# Patient Record
Sex: Female | Born: 2000 | Race: Black or African American | Hispanic: No | Marital: Single | Smoking: Never smoker
Health system: Southern US, Community
[De-identification: ages and names within clinical notes are randomized; demographics above are authoritative.]

## PROBLEM LIST (undated history)

## (undated) DIAGNOSIS — Z789 Other specified health status: Secondary | ICD-10-CM

## (undated) HISTORY — DX: Other specified health status: Z78.9

## (undated) HISTORY — PX: NO PAST SURGERIES: SHX2092

---

## 2019-01-29 ENCOUNTER — Other Ambulatory Visit: Payer: Self-pay

## 2019-01-29 ENCOUNTER — Inpatient Hospital Stay (HOSPITAL_COMMUNITY)
Admission: EM | Admit: 2019-01-29 | Discharge: 2019-01-30 | Disposition: A | Discharge status: Home / Self Care | Payer: BC Managed Care – PPO | Attending: Obstetrics & Gynecology | Admitting: Obstetrics & Gynecology

## 2019-01-29 ENCOUNTER — Encounter (HOSPITAL_COMMUNITY): Payer: Self-pay | Admitting: Emergency Medicine

## 2019-01-29 DIAGNOSIS — Z3491 Encounter for supervision of normal pregnancy, unspecified, first trimester: Secondary | ICD-10-CM | POA: Insufficient documentation

## 2019-01-29 DIAGNOSIS — Z3A13 13 weeks gestation of pregnancy: Secondary | ICD-10-CM | POA: Insufficient documentation

## 2019-01-29 DIAGNOSIS — Z3689 Encounter for other specified antenatal screening: Secondary | ICD-10-CM | POA: Diagnosis not present

## 2019-01-29 DIAGNOSIS — Z3201 Encounter for pregnancy test, result positive: Secondary | ICD-10-CM

## 2019-01-29 LAB — POC URINE PREG, ED: Preg Test, Ur: POSITIVE — AB

## 2019-01-29 NOTE — ED Triage Notes (Signed)
Pt presents requesting preg test, pt states she did have +preg test end of July, some spotting in August and the week of Sept 12. +POC urine here. Pt has not been seen by OB/GYN. No bleeding today.

## 2019-01-30 ENCOUNTER — Other Ambulatory Visit: Payer: Self-pay

## 2019-01-30 ENCOUNTER — Encounter (HOSPITAL_COMMUNITY): Payer: Self-pay

## 2019-01-30 DIAGNOSIS — Z3689 Encounter for other specified antenatal screening: Secondary | ICD-10-CM

## 2019-01-30 NOTE — Discharge Instructions (Signed)
[facility] ° ° °[facility]       Phone: [phone] ° °[facility] Phone: [phone] ° °[facility] [place]  Phone: [phone] ° °[facility] [place]  Phone: [phone] ° °[facility]  Phone: [phone] ° °[facility]       Phone: [phone] ° °[facility]    Phone: [phone]  ° °[facility]    Phone: [phone] ° °[facility]    Phone: [phone] ° °[facility]    Phone: [phone]  ° °[facility]  Phone: [phone] ° °[facility]    Phone: [phone] ° °[facility]   Phone: [phone] ° °[facility]    Phone: [phone] ° °

## 2019-01-30 NOTE — MAU Provider Note (Signed)
First Provider Initiated Contact with Patient 01/30/19 0037      S Ms. Sophia King is a 18 y.o. G1P0 at 13.0 weeks by LMP.  She reports that she "just want to find out how far along" she is.  Patient confirms that her LMP October 31, 2018 was normal and that she had positive home UPT in July.  Patient reports bleeding on Sept 12 that was self limiting and has not occurred since.  Patient denies all pain and vaginal concerns.   O BP 118/66 (BP Location: Right Arm)   Pulse 90   Temp 98.7 F (37.1 C) (Oral)   Resp 16   Ht 5\' 9"  (1.753 m)   Wt 91.2 kg   LMP 10/31/2018 (Approximate)   SpO2 100%   BMI 29.70 kg/m  Physical Exam  Constitutional: She is oriented to person, place, and time. She appears well-developed and well-nourished.  HENT:  Head: Normocephalic and atraumatic.  Eyes: Conjunctivae are normal.  Neck: Normal range of motion.  Cardiovascular: Normal rate.  Respiratory: Effort normal.  GI: Soft.  Musculoskeletal: Normal range of motion.  Neurological: She is alert and oriented to person, place, and time.  Skin: Skin is warm and dry.  Psychiatric: She has a normal mood and affect. Her behavior is normal.   Patient informed that the ultrasound is considered a limited OB ultrasound and is not intended to be a complete ultrasound exam.  Patient also informed that the ultrasound is not being completed with the intent of assessing for fetal or placental anomalies or any pelvic abnormalities.  Explained that the purpose of today's ultrasound is to assess for  viability.  Patient acknowledges the purpose of the exam and the limitations of the study.   A Pregnant female No Complaints  Medical screening exam complete   P -Attempted to doppler without success. -BSUS performed and no IUP noted. -Patient informed that dating may be incorrect, but that she would have to follow up with primary ob. -Patient offered and accepts list of ob providers in the community. -Bleeding  Precautions Given. -Discharge from MAU in stable condition -Patient encouraged to return to MAU as needed for pregnancy related complaints.  Gavin Pound, CNM 01/30/2019 12:38 AM

## 2019-01-30 NOTE — MAU Note (Signed)
Pt had +upt in ED. Wants to verify pregnancy. Had some spotting on 01/15/19. None today. No pain today. LMP: 10/31/18. Just moved to [place] a few weeks ago and has not seen provider. Looking for recommendations.

## 2019-02-08 ENCOUNTER — Telehealth: Payer: Self-pay | Admitting: General Practice

## 2019-02-08 NOTE — Telephone Encounter (Signed)
Pt aware of appointment change on 02/17/2019 at 1:00pm and verbalized understanding.

## 2019-02-17 ENCOUNTER — Other Ambulatory Visit: Payer: Self-pay

## 2019-02-17 ENCOUNTER — Ambulatory Visit (INDEPENDENT_AMBULATORY_CARE_PROVIDER_SITE_OTHER): Payer: BC Managed Care – PPO | Admitting: *Deleted

## 2019-02-17 VITALS — Wt 200.0 lb

## 2019-02-17 DIAGNOSIS — Z3400 Encounter for supervision of normal first pregnancy, unspecified trimester: Secondary | ICD-10-CM | POA: Insufficient documentation

## 2019-02-17 NOTE — Progress Notes (Signed)
  Virtual Visit via Telephone Note  I connected with Sophia King on 02/17/19 at  1:00 PM EDT by telephone and verified that I am speaking with the correct person using two identifiers.  Location: Patient: Sophia King MRN: [id number] Provider: Derl Barrow, RN   I discussed the limitations, risks, security and privacy concerns of performing an evaluation and management service by telephone and the availability of in person appointments. I also discussed with the patient that there may be a patient responsible charge related to this service. The patient expressed understanding and agreed to proceed.   History of Present Illness: PRENATAL INTAKE SUMMARY  Sophia King presents today New OB Nurse Interview.  OB History    Gravida  1   Para      Term      Preterm      AB      Living        SAB      TAB      Ectopic      Multiple      Live Births             I have reviewed the patient's medical, obstetrical, social, and family histories, medications, and available lab results.  SUBJECTIVE She complains of vaginal bleeding (spotting, sometime after sex).   Observations/Objective: Initial nurse interview for history/labs (New OB).  EDD: 09/09/2019 by LMP GA: [redacted]w[redacted]d G1P0 FHT: non face to face interview  GENERAL APPEARANCE: non face to face  Assessment and Plan: Normal pregnancy Prenatal care- [facility] Labs/physical to be completed at next visit. Advised patient on warner signs of pregnancy Pt to purchase PNV Pt to sign up for Babys[facility].  Follow Up Instructions:   I discussed the assessment and treatment plan with the patient. The patient was provided an opportunity to ask questions and all were answered. The patient agreed with the plan and demonstrated an understanding of the instructions.   The patient was advised to call back or seek an in-person evaluation if the symptoms worsen or if the condition fails to improve as anticipated.  I  provided 10 minutes of non-face-to-face time during this encounter.   Derl Barrow, RN

## 2019-02-21 ENCOUNTER — Other Ambulatory Visit: Payer: Self-pay | Admitting: *Deleted

## 2019-02-21 DIAGNOSIS — Z3400 Encounter for supervision of normal first pregnancy, unspecified trimester: Secondary | ICD-10-CM

## 2019-02-21 NOTE — Progress Notes (Signed)
Per Laury Deep CNM's order; patient need early ultrasound to confirm dating.  Derl Barrow, RN

## 2019-02-22 ENCOUNTER — Inpatient Hospital Stay (HOSPITAL_COMMUNITY): Payer: BC Managed Care – PPO

## 2019-02-22 ENCOUNTER — Encounter (HOSPITAL_COMMUNITY): Payer: Self-pay

## 2019-02-22 ENCOUNTER — Inpatient Hospital Stay (HOSPITAL_COMMUNITY)
Admission: AD | Admit: 2019-02-22 | Discharge: 2019-02-22 | Disposition: A | Discharge status: Home / Self Care | Payer: BC Managed Care – PPO | Attending: Obstetrics & Gynecology | Admitting: Obstetrics & Gynecology

## 2019-02-22 ENCOUNTER — Other Ambulatory Visit: Payer: Self-pay

## 2019-02-22 DIAGNOSIS — Z3A11 11 weeks gestation of pregnancy: Secondary | ICD-10-CM | POA: Diagnosis not present

## 2019-02-22 DIAGNOSIS — A5901 Trichomonal vulvovaginitis: Secondary | ICD-10-CM | POA: Diagnosis present

## 2019-02-22 DIAGNOSIS — O98811 Other maternal infectious and parasitic diseases complicating pregnancy, first trimester: Secondary | ICD-10-CM

## 2019-02-22 DIAGNOSIS — O209 Hemorrhage in early pregnancy, unspecified: Secondary | ICD-10-CM | POA: Diagnosis not present

## 2019-02-22 DIAGNOSIS — N939 Abnormal uterine and vaginal bleeding, unspecified: Secondary | ICD-10-CM

## 2019-02-22 DIAGNOSIS — O98311 Other infections with a predominantly sexual mode of transmission complicating pregnancy, first trimester: Secondary | ICD-10-CM | POA: Diagnosis not present

## 2019-02-22 LAB — URINALYSIS, ROUTINE W REFLEX MICROSCOPIC
Bilirubin Urine: NEGATIVE
Glucose, UA: NEGATIVE mg/dL
Ketones, ur: NEGATIVE mg/dL
Nitrite: NEGATIVE
Protein, ur: NEGATIVE mg/dL
Specific Gravity, Urine: 1.019 (ref 1.005–1.030)
pH: 5 (ref 5.0–8.0)

## 2019-02-22 LAB — WET PREP, GENITAL
Clue Cells Wet Prep HPF POC: NONE SEEN
Sperm: NONE SEEN
Yeast Wet Prep HPF POC: NONE SEEN

## 2019-02-22 MED ORDER — METRONIDAZOLE 500 MG PO TABS
2000.0000 mg | ORAL_TABLET | Freq: Once | ORAL | Status: AC
  Administered 2019-02-22: 2000 mg via ORAL
  Filled 2019-02-22 (×2): qty 4

## 2019-02-22 NOTE — Discharge Instructions (Signed)
Trichomoniasis Trichomoniasis is an STI (sexually transmitted infection) that can affect both women and men. In women, the outer area of the female genitalia (vulva) and the vagina are affected. In men, mainly the penis is affected, but the prostate and other reproductive organs can also be involved.  This condition can be treated with medicine. It often has no symptoms (is asymptomatic), especially in men. If not treated, trichomoniasis can last for months or years. What are the causes? This condition is caused by a parasite called Trichomonas vaginalis. Trichomoniasis most often spreads from person to person (is contagious) through sexual contact. What increases the risk? The following factors may make you more likely to develop this condition:  Having unprotected sex.  Having sex with a partner who has trichomoniasis.  Having multiple sexual partners.  Having had previous trichomoniasis infections or other STIs. What are the signs or symptoms? In women, symptoms of trichomoniasis include:  Abnormal vaginal discharge that is clear, white, gray, or yellow-green and foamy and has an unusual "fishy" odor.  Itching and irritation of the vagina and vulva.  Burning or pain during urination or sex.  Redness and swelling of the genitals. In men, symptoms of trichomoniasis include:  Penile discharge that may be foamy or contain pus.  Pain in the penis. This may happen only when urinating.  Itching or irritation inside the penis.  Burning after urination or ejaculation. How is this diagnosed? In women, this condition may be found during a routine Pap test or physical exam. It may be found in men during a routine physical exam. Your health care provider may do tests to help diagnose this infection, such as:  Urine tests (men and women).  The following in women: ? Testing the pH of the vagina. ? A vaginal swab test that checks for the Trichomonas vaginalis parasite. ? Testing vaginal  secretions. Your health care provider may test you for other STIs, including HIV (human immunodeficiency virus). How is this treated? This condition is treated with medicine taken by mouth (orally), such as metronidazole or tinidazole, to fight the infection. Your sexual partner(s) also need to be tested and treated.  If you are a woman and you plan to become pregnant or think you may be pregnant, tell your health care provider right away. Some medicines that are used to treat the infection should not be taken during pregnancy. Your health care provider may recommend over-the-counter medicines or creams to help relieve itching or irritation. You may be tested for infection again 3 months after treatment. Follow these instructions at home:  Take and use over-the-counter and prescription medicines, including creams, only as told by your health care provider.  Take your antibiotic medicine as told by your health care provider. Do not stop taking the antibiotic even if you start to feel better.  Do not have sex until 7-10 days after you finish your medicine, or until your health care provider approves. Ask your health care provider when you may start to have sex again.  (Women) Do not douche or wear tampons while you have the infection.  Discuss your infection with your sexual partner(s). Make sure that your partner gets tested and treated, if necessary.  Keep all follow-up visits as told by your health care provider. This is important. How is this prevented?   Use condoms every time you have sex. Using condoms correctly and consistently can help protect against STIs.  Avoid having multiple sexual partners.  Talk with your sexual partner about any   symptoms that either of you may have, as well as any history of STIs.  Get tested for STIs and STDs (sexually transmitted diseases) before you have sex. Ask your partner to do the same.  Do not have sexual contact if you have symptoms of  trichomoniasis or another STI. Contact a health care provider if:  You still have symptoms after you finish your medicine.  You develop pain in your abdomen.  You have pain when you urinate.  You have bleeding after sex.  You develop a rash.  You feel nauseous or you vomit.  You plan to become pregnant or think you may be pregnant. Summary  Trichomoniasis is an STI (sexually transmitted infection) that can affect both women and men.  This condition often has no symptoms (is asymptomatic), especially in men.  Without treatment, this condition can last for months or years.  You should not have sex until 7-10 days after you finish your medicine, or until your health care provider approves. Ask your health care provider when you may start to have sex again.  Discuss your infection with your sexual partner(s). Make sure that your partner gets tested and treated, if necessary. This information is not intended to replace advice given to you by your health care provider. Make sure you discuss any questions you have with your health care provider. Document Released: 10/15/2000 Document Revised: 02/02/2018 Document Reviewed: 02/02/2018 Elsevier Patient Education  2020 Elsevier Inc.  

## 2019-02-22 NOTE — MAU Note (Signed)
Pt presents to MAU with c/o vaginal bleeding, she describes bleeding as light pink spotting with some bright red. Pt denies pain.

## 2019-02-22 NOTE — MAU Provider Note (Signed)
Patient Sophia King is a 18 y.o. G1P0 At [redacted]w[redacted]d here with complaints of vaginal bleeding that occurred last night. She denies pain with urination, abnormal vaginal discharge, pain with intercourse, or any other ob-gyn complaint. She reports that she had "two drops" of blood on her toilet paper last night and she came in because she was worried.   She has not had a prenatal visit thus far nor had an [place]; she has one scheduled for next week.  History     CSN: [id number]  Arrival date and time: 02/22/19 4010   None     Chief Complaint  Patient presents with  . Vaginal Bleeding   Vaginal Bleeding The patient's primary symptoms include vaginal bleeding. The patient's pertinent negatives include no genital itching or pelvic pain. This is a new problem. The current episode started yesterday. The problem occurs rarely. The problem has been resolved. The patient is experiencing no pain. Pertinent negatives include no back pain, constipation, diarrhea, urgency or vomiting. The vaginal discharge was bloody. The vaginal bleeding is spotting (She has occasional pink spotting over the course of her pregnancy but last night she had two drops of bright red blood on her toilet paper when she wiped. ). Nothing aggravates the symptoms.    OB History    Gravida  1   Para      Term      Preterm      AB      Living        SAB      TAB      Ectopic      Multiple      Live Births              Past Medical History:  Diagnosis Date  . Medical history non-contributory     Past Surgical History:  Procedure Laterality Date  . NO PAST SURGERIES      History reviewed. No pertinent family history.  Social History   Tobacco Use  . Smoking status: Never Smoker  . Smokeless tobacco: Never Used  Substance Use Topics  . Alcohol use: Not Currently  . Drug use: Not Currently    Allergies: No Known Allergies  No medications prior to admission.    Review of Systems   Gastrointestinal: Negative for constipation, diarrhea and vomiting.  Genitourinary: Positive for vaginal bleeding. Negative for pelvic pain and urgency.  Musculoskeletal: Negative for back pain.   Physical Exam   Blood pressure 131/73, pulse (!) 101, temperature 98.8 F (37.1 C), resp. rate 18, last menstrual period 12/03/2018, SpO2 100 %.  Physical Exam  Constitutional: She is oriented to person, place, and time. She appears well-developed.  HENT:  Head: Normocephalic.  Neck: Normal range of motion.  GI: Soft.  Genitourinary:    Vagina normal.     Genitourinary Comments: NEFG; no tenderness on exam;  Speculum exam deferred.    Neurological: She is alert and oriented to person, place, and time. She has normal reflexes.  Skin: Skin is warm.    MAU Course  Procedures  MDM -Complete ectopic workup performed;  -US shows IUP with heartbeat of 170; no SCH.  -wet prep show trich; treated with 2 grams of Flagyl, patient tolerated well.   Assessment and Plan   1. Trichomoniasis of vagina   2. Vaginal bleeding    -Patient to keep NOB appt next week -Partner RX given; all questions answered.  -Explained importance of partner treatment; plan for abstinence until  both partners have been treated and it has been at least 7 days.  -Reviewed warning signs and when to return to MAU.   Mervyn Skeeters Izayah Miner 02/22/2019, 10:31 AM

## 2019-02-24 LAB — GC/CHLAMYDIA PROBE AMP (~~LOC~~) NOT AT ARMC
Chlamydia: NEGATIVE
Comment: NEGATIVE
Comment: NORMAL
Neisseria Gonorrhea: NEGATIVE

## 2019-02-28 ENCOUNTER — Telehealth: Payer: Self-pay | Admitting: *Deleted

## 2019-02-28 NOTE — Telephone Encounter (Signed)
Patient informed that ultrasound scheduled on 03/02/2019 was cancelled. Pt was seen at MAU on 02/22/2019, ultrasound completed at that time.   Derl Barrow, RN

## 2019-02-28 NOTE — Telephone Encounter (Signed)
-----   Message from Laury Deep, [place] sent at 02/28/2019  1:23 PM EDT ----- Regarding: Cancel U/S appt This patient had an ultrasound on 02/22/19. An ultrasonographer contacted me to see if she needed to have another dating U/S. She doesn't need another one. Please call her to explain that it is not needed because she already had one.  Thanks, Ro

## 2019-02-28 NOTE — Addendum Note (Signed)
Addended by: Derl Barrow on: 02/28/2019 02:01 PM   Modules accepted: Orders

## 2019-03-02 ENCOUNTER — Ambulatory Visit (HOSPITAL_COMMUNITY): Admission: RE | Admit: 2019-03-02 | Payer: BC Managed Care – PPO | Source: Ambulatory Visit

## 2019-03-02 ENCOUNTER — Encounter (HOSPITAL_COMMUNITY): Payer: Self-pay

## 2019-03-04 ENCOUNTER — Encounter: Payer: Self-pay | Admitting: General Practice

## 2019-03-04 ENCOUNTER — Other Ambulatory Visit: Payer: Self-pay

## 2019-03-04 ENCOUNTER — Ambulatory Visit (INDEPENDENT_AMBULATORY_CARE_PROVIDER_SITE_OTHER): Payer: BC Managed Care – PPO | Admitting: Obstetrics & Gynecology

## 2019-03-04 ENCOUNTER — Encounter: Payer: Self-pay | Admitting: Obstetrics & Gynecology

## 2019-03-04 VITALS — BP 126/71 | HR 102 | Temp 98.4°F | Wt 199.6 lb

## 2019-03-04 DIAGNOSIS — Z6791 Unspecified blood type, Rh negative: Secondary | ICD-10-CM

## 2019-03-04 DIAGNOSIS — Z3A12 12 weeks gestation of pregnancy: Secondary | ICD-10-CM

## 2019-03-04 DIAGNOSIS — O26891 Other specified pregnancy related conditions, first trimester: Secondary | ICD-10-CM

## 2019-03-04 DIAGNOSIS — O360120 Maternal care for anti-D [Rh] antibodies, second trimester, not applicable or unspecified: Secondary | ICD-10-CM

## 2019-03-04 DIAGNOSIS — Z3400 Encounter for supervision of normal first pregnancy, unspecified trimester: Secondary | ICD-10-CM

## 2019-03-04 DIAGNOSIS — O26899 Other specified pregnancy related conditions, unspecified trimester: Secondary | ICD-10-CM

## 2019-03-04 NOTE — Progress Notes (Signed)
  Subjective:    Sophia King is being seen today for her first obstetrical visit. G1  This is not a planned pregnancy. She is at [redacted]w[redacted]d gestation. Her obstetrical history is significant for no risks.. 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  Patient does intend to breast feed. Pregnancy history fully reviewed. Had trich earlier this month. Pt and partner treated.   Patient reports no complaints.  Review of Systems:   Review of Systems none.  A maternal grandfather has DM Her partners cousin has autism  Objective:     LMP 12/03/2018  Physical Exam  Exam BP 126/71   Pulse (!) 102   Temp 98.4 F (36.9 C)   Wt 199 lb 9.6 oz (90.5 kg)   LMP 12/03/2018   BMI 29.48 kg/m   CONSTITUTIONAL: Well-developed, well-nourished female in no acute distress.  HENT:  Normocephalic, atraumatic EYES: Conjunctivae and EOM are normal. No scleral icterus.  NECK: Normal range of motion SKIN: Skin is warm and dry. No rash noted. Not diaphoretic.No pallor. [place]: Alert and oriented to person, place, and time. Normal coordination.  Pelvic; not done. Pt has exam in Sept and early oct.    Assessment:    Pregnancy: G1P0 Patient Active Problem List   Diagnosis Date Noted  . Trichomoniasis of vagina 02/22/2019  . Supervision of normal first pregnancy, antepartum 02/17/2019       Plan:     Initial labs drawn. Prenatal vitamins. Problem list reviewed and updated. AFP3 discussed: requested. Role of ultrasound in pregnancy discussed; fetal survey: requested. Amniocentesis discussed: not indicated. Follow up in 4 weeks. Needs AFP at next viist 100% of 30 min visit spent on counseling and coordination of care.  Prenatal labs today Pt has Baby scripts and MyChart   Lavonia Drafts 03/04/2019

## 2019-03-04 NOTE — Patient Instructions (Addendum)
Breastfeeding  Choosing to breastfeed is one of the best decisions you can make for yourself and your baby. A change in hormones during pregnancy causes your breasts to make breast milk in your milk-producing glands. Hormones prevent breast milk from being released before your baby is born. They also prompt milk flow after birth. Once breastfeeding has begun, thoughts of your baby, as well as his or her sucking or crying, can stimulate the release of milk from your milk-producing glands. Benefits of breastfeeding Research shows that breastfeeding offers many health benefits for infants and mothers. It also offers a cost-free and convenient way to feed your baby. For your baby  Your first milk (colostrum) helps your baby's digestive system to function better.  Special cells in your milk (antibodies) help your baby to fight off infections.  Breastfed babies are less likely to develop asthma, allergies, obesity, or type 2 diabetes. They are also at lower risk for sudden infant death syndrome (SIDS).  Nutrients in breast milk are better able to meet your baby's needs compared to infant formula.  Breast milk improves your baby's brain development. For you  Breastfeeding helps to create a very special bond between you and your baby.  Breastfeeding is convenient. Breast milk costs nothing and is always available at the correct temperature.  Breastfeeding helps to burn calories. It helps you to lose the weight that you gained during pregnancy.  Breastfeeding makes your uterus return faster to its size before pregnancy. It also slows bleeding (lochia) after you give birth.  Breastfeeding helps to lower your risk of developing type 2 diabetes, osteoporosis, rheumatoid arthritis, cardiovascular disease, and breast, ovarian, uterine, and endometrial cancer later in life. Breastfeeding basics Starting breastfeeding  Find a comfortable place to sit or lie down, with your neck and back well-supported.   Place a pillow or a rolled-up blanket under your baby to bring him or her to the level of your breast (if you are seated). Nursing pillows are specially designed to help support your arms and your baby while you breastfeed.  Make sure that your baby's tummy (abdomen) is facing your abdomen.  Gently massage your breast. With your fingertips, massage from the outer edges of your breast inward toward the nipple. This encourages milk flow. If your milk flows slowly, you may need to continue this action during the feeding.  Support your breast with 4 fingers underneath and your thumb above your nipple (make the letter "C" with your hand). Make sure your fingers are well away from your nipple and your baby's mouth.  Stroke your baby's lips gently with your finger or nipple.  When your baby's mouth is open wide enough, quickly bring your baby to your breast, placing your entire nipple and as much of the areola as possible into your baby's mouth. The areola is the colored area around your nipple. ? More areola should be visible above your baby's upper lip than below the lower lip. ? Your baby's lips should be opened and extended outward (flanged) to ensure an adequate, comfortable latch. ? Your baby's tongue should be between his or her lower gum and your breast.  Make sure that your baby's mouth is correctly positioned around your nipple (latched). Your baby's lips should create a seal on your breast and be turned out (everted).  It is common for your baby to suck about 2-3 minutes in order to start the flow of breast milk. Latching Teaching your baby how to latch onto your breast properly is  very important. An improper latch can cause nipple pain, decreased milk supply, and poor weight gain in your baby. Also, if your baby is not latched onto your nipple properly, he or she may swallow some air during feeding. This can make your baby fussy. Burping your baby when you switch breasts during the feeding  can help to get rid of the air. However, teaching your baby to latch on properly is still the best way to prevent fussiness from swallowing air while breastfeeding. Signs that your baby has successfully latched onto your nipple  Silent tugging or silent sucking, without causing you pain. Infant's lips should be extended outward (flanged).  Swallowing heard between every 3-4 sucks once your milk has started to flow (after your let-down milk reflex occurs).  Muscle movement above and in front of his or her ears while sucking. Signs that your baby has not successfully latched onto your nipple  Sucking sounds or smacking sounds from your baby while breastfeeding.  Nipple pain. If you think your baby has not latched on correctly, slip your finger into the corner of your baby's mouth to break the suction and place it between your baby's gums. Attempt to start breastfeeding again. Signs of successful breastfeeding Signs from your baby  Your baby will gradually decrease the number of sucks or will completely stop sucking.  Your baby will fall asleep.  Your baby's body will relax.  Your baby will retain a small amount of milk in his or her mouth.  Your baby will let go of your breast by himself or herself. Signs from you  Breasts that have increased in firmness, weight, and size 1-3 hours after feeding.  Breasts that are softer immediately after breastfeeding.  Increased milk volume, as well as a change in milk consistency and color by the fifth day of breastfeeding.  Nipples that are not sore, cracked, or bleeding. Signs that your baby is getting enough milk  Wetting at least 1-2 diapers during the first 24 hours after birth.  Wetting at least 5-6 diapers every 24 hours for the first week after birth. The urine should be clear or pale yellow by the age of 5 days.  Wetting 6-8 diapers every 24 hours as your baby continues to grow and develop.  At least 3 stools in a 24-hour period  by the age of 5 days. The stool should be soft and yellow.  At least 3 stools in a 24-hour period by the age of 7 days. The stool should be seedy and yellow.  No loss of weight greater than 10% of birth weight during the first 3 days of life.  Average weight gain of 4-7 oz (113-198 g) per week after the age of 4 days.  Consistent daily weight gain by the age of 5 days, without weight loss after the age of 2 weeks. After a feeding, your baby may spit up a small amount of milk. This is normal. Breastfeeding frequency and duration Frequent feeding will help you make more milk and can prevent sore nipples and extremely full breasts (breast engorgement). Breastfeed when you feel the need to reduce the fullness of your breasts or when your baby shows signs of hunger. This is called "breastfeeding on demand." Signs that your baby is hungry include:  Increased alertness, activity, or restlessness.  Movement of the head from side to side.  Opening of the mouth when the corner of the mouth or cheek is stroked (rooting).  Increased sucking sounds, smacking lips, cooing,  sighing, or squeaking.  Hand-to-mouth movements and sucking on fingers or hands.  Fussing or crying. Avoid introducing a pacifier to your baby in the first 4-6 weeks after your baby is born. After this time, you may choose to use a pacifier. Research has shown that pacifier use during the first year of a baby's life decreases the risk of sudden infant death syndrome (SIDS). Allow your baby to feed on each breast as long as he or she wants. When your baby unlatches or falls asleep while feeding from the first breast, offer the second breast. Because newborns are often sleepy in the first few weeks of life, you may need to awaken your baby to get him or her to feed. Breastfeeding times will vary from baby to baby. However, the following rules can serve as a guide to help you make sure that your baby is properly fed:  Newborns (babies 64  weeks of age or younger) may breastfeed every 1-3 hours.  Newborns should not go without breastfeeding for longer than 3 hours during the day or 5 hours during the night.  You should breastfeed your baby a minimum of 8 times in a 24-hour period. Breast milk pumping     Pumping and storing breast milk allows you to make sure that your baby is exclusively fed your breast milk, even at times when you are unable to breastfeed. This is especially important if you go back to work while you are still breastfeeding, or if you are not able to be present during feedings. Your lactation consultant can help you find a method of pumping that works best for you and give you guidelines about how long it is safe to store breast milk. Caring for your breasts while you breastfeed Nipples can become dry, cracked, and sore while breastfeeding. The following recommendations can help keep your breasts moisturized and healthy:  Avoid using soap on your nipples.  Wear a supportive bra designed especially for nursing. Avoid wearing underwire-style bras or extremely tight bras (sports bras).  Air-dry your nipples for 3-4 minutes after each feeding.  Use only cotton bra pads to absorb leaked breast milk. Leaking of breast milk between feedings is normal.  Use lanolin on your nipples after breastfeeding. Lanolin helps to maintain your skin's normal moisture barrier. Pure lanolin is not harmful (not toxic) to your baby. You may also hand express a few drops of breast milk and gently massage that milk into your nipples and allow the milk to air-dry. In the first few weeks after giving birth, some women experience breast engorgement. Engorgement can make your breasts feel heavy, warm, and tender to the touch. Engorgement peaks within 3-5 days after you give birth. The following recommendations can help to ease engorgement:  Completely empty your breasts while breastfeeding or pumping. You may want to start by applying  warm, moist heat (in the shower or with warm, water-soaked hand towels) just before feeding or pumping. This increases circulation and helps the milk flow. If your baby does not completely empty your breasts while breastfeeding, pump any extra milk after he or she is finished.  Apply ice packs to your breasts immediately after breastfeeding or pumping, unless this is too uncomfortable for you. To do this: ? Put ice in a plastic bag. ? Place a towel between your skin and the bag. ? Leave the ice on for 20 minutes, 2-3 times a day.  Make sure that your baby is latched on and positioned properly while breastfeeding. If  engorgement persists after 48 hours of following these recommendations, contact your health care provider or a [employer]. Overall health care recommendations while breastfeeding  Eat 3 healthy meals and 3 snacks every day. Well-nourished mothers who are breastfeeding need an additional 450-500 calories a day. You can meet this requirement by increasing the amount of a balanced diet that you eat.  Drink enough water to keep your urine pale yellow or clear.  Rest often, relax, and continue to take your prenatal vitamins to prevent fatigue, stress, and low vitamin and mineral levels in your body (nutrient deficiencies).  Do not use any products that contain nicotine or tobacco, such as cigarettes and e-cigarettes. Your baby may be harmed by chemicals from cigarettes that pass into breast milk and exposure to secondhand smoke. If you need help quitting, ask your health care provider.  Avoid alcohol.  Do not use illegal drugs or marijuana.  Talk with your health care provider before taking any medicines. These include over-the-counter and prescription medicines as well as vitamins and herbal supplements. Some medicines that may be harmful to your baby can pass through breast milk.  It is possible to become pregnant while breastfeeding. If birth control is desired, ask your  health care provider about options that will be safe while breastfeeding your baby. Where to find more information: [employer]: www.llli.org Contact a health care provider if:  You feel like you want to stop breastfeeding or have become frustrated with breastfeeding.  Your nipples are cracked or bleeding.  Your breasts are red, tender, or warm.  You have: ? Painful breasts or nipples. ? A swollen area on either breast. ? A fever or chills. ? Nausea or vomiting. ? Drainage other than breast milk from your nipples.  Your breasts do not become full before feedings by the fifth day after you give birth.  You feel sad and depressed.  Your baby is: ? Too sleepy to eat well. ? Having trouble sleeping. ? More than 31 week old and wetting fewer than 6 diapers in a 24-hour period. ? Not gaining weight by 71 days of age.  Your baby has fewer than 3 stools in a 24-hour period.  Your baby's skin or the white parts of his or her eyes become yellow. Get help right away if:  Your baby is overly tired (lethargic) and does not want to wake up and feed.  Your baby develops an unexplained fever. Summary  Breastfeeding offers many health benefits for infant and mothers.  Try to breastfeed your infant when he or she shows early signs of hunger.  Gently tickle or stroke your baby's lips with your finger or nipple to allow the baby to open his or her mouth. Bring the baby to your breast. Make sure that much of the areola is in your baby's mouth. Offer one side and burp the baby before you offer the other side.  Talk with your health care provider or lactation consultant if you have questions or you face problems as you breastfeed. This information is not intended to replace advice given to you by your health care provider. Make sure you discuss any questions you have with your health care provider. Document Released: 04/21/2005 Document Revised: 07/16/2017 Document Reviewed:  05/23/2016 Elsevier Patient Education  2020 [employer]. First Trimester of Pregnancy The first trimester of pregnancy is from week 1 until the end of week 13 (months 1 through 3). A week after a sperm fertilizes an egg, the egg  will implant on the wall of the uterus. This embryo will begin to develop into a baby. Genes from you and your partner will form the baby. The female genes will determine whether the baby will be a boy or a girl. At 6-8 weeks, the eyes and face will be formed, and the heartbeat can be seen on ultrasound. At the end of 12 weeks, all the baby's organs will be formed. Now that you are pregnant, you will want to do everything you can to have a healthy baby. Two of the most important things are to get good prenatal care and to follow your health care provider's instructions. Prenatal care is all the medical care you receive before the baby's birth. This care will help prevent, find, and treat any problems during the pregnancy and childbirth. Body changes during your first trimester Your body goes through many changes during pregnancy. The changes vary from woman to woman.  You may gain or lose a couple of pounds at first.  You may feel sick to your stomach (nauseous) and you may throw up (vomit). If the vomiting is uncontrollable, call your health care provider.  You may tire easily.  You may develop headaches that can be relieved by medicines. All medicines should be approved by your health care provider.  You may urinate more often. Painful urination may mean you have a bladder infection.  You may develop heartburn as a result of your pregnancy.  You may develop constipation because certain hormones are causing the muscles that push stool through your intestines to slow down.  You may develop hemorrhoids or swollen veins (varicose veins).  Your breasts may begin to grow larger and become tender. Your nipples may stick out more, and the tissue that surrounds them (areola)  may become darker.  Your gums may bleed and may be sensitive to brushing and flossing.  Dark spots or blotches (chloasma, mask of pregnancy) may develop on your face. This will likely fade after the baby is born.  Your menstrual periods will stop.  You may have a loss of appetite.  You may develop cravings for certain kinds of food.  You may have changes in your emotions from day to day, such as being excited to be pregnant or being concerned that something may go wrong with the pregnancy and baby.  You may have more vivid and strange dreams.  You may have changes in your hair. These can include thickening of your hair, rapid growth, and changes in texture. Some women also have hair loss during or after pregnancy, or hair that feels dry or thin. Your hair will most likely return to normal after your baby is born. What to expect at prenatal visits During a routine prenatal visit:  You will be weighed to make sure you and the baby are growing normally.  Your blood pressure will be taken.  Your abdomen will be measured to track your baby's growth.  The fetal heartbeat will be listened to between weeks 10 and 14 of your pregnancy.  Test results from any previous visits will be discussed. Your health care provider may ask you:  How you are feeling.  If you are feeling the baby move.  If you have had any abnormal symptoms, such as leaking fluid, bleeding, severe headaches, or abdominal cramping.  If you are using any tobacco products, including cigarettes, chewing tobacco, and electronic cigarettes.  If you have any questions. Other tests that may be performed during your first trimester include:  Blood tests to find your blood type and to check for the presence of any previous infections. The tests will also be used to check for low iron levels (anemia) and protein on red blood cells (Rh antibodies). Depending on your risk factors, or if you previously had diabetes during  pregnancy, you may have tests to check for high blood sugar that affects pregnant women (gestational diabetes).  Urine tests to check for infections, diabetes, or protein in the urine.  An ultrasound to confirm the proper growth and development of the baby.  Fetal screens for spinal cord problems (spina bifida) and Down syndrome.  HIV (human immunodeficiency virus) testing. Routine prenatal testing includes screening for HIV, unless you choose not to have this test.  You may need other tests to make sure you and the baby are doing well. Follow these instructions at home: Medicines  Follow your health care provider's instructions regarding medicine use. Specific medicines may be either safe or unsafe to take during pregnancy.  Take a prenatal vitamin that contains at least 600 micrograms (mcg) of folic acid.  If you develop constipation, try taking a stool softener if your health care provider approves. Eating and drinking   Eat a balanced diet that includes fresh fruits and vegetables, whole grains, good sources of protein such as meat, eggs, or tofu, and low-fat dairy. Your health care provider will help you determine the amount of weight gain that is right for you.  Avoid raw meat and uncooked cheese. These carry germs that can cause birth defects in the baby.  Eating four or five small meals rather than three large meals a day may help relieve nausea and vomiting. If you start to feel nauseous, eating a few soda crackers can be helpful. Drinking liquids between meals, instead of during meals, also seems to help ease nausea and vomiting.  Limit foods that are high in fat and processed sugars, such as fried and sweet foods.  To prevent constipation: ? Eat foods that are high in fiber, such as fresh fruits and vegetables, whole grains, and beans. ? Drink enough fluid to keep your urine clear or pale yellow. Activity  Exercise only as directed by your health care provider. Most  women can continue their usual exercise routine during pregnancy. Try to exercise for 30 minutes at least 5 days a week. Exercising will help you: ? Control your weight. ? Stay in shape. ? Be prepared for labor and delivery.  Experiencing pain or cramping in the lower abdomen or lower back is a good sign that you should stop exercising. Check with your health care provider before continuing with normal exercises.  Try to avoid standing for long periods of time. Move your legs often if you must stand in one place for a long time.  Avoid heavy lifting.  Wear low-heeled shoes and practice good posture.  You may continue to have sex unless your health care provider tells you not to. Relieving pain and discomfort  Wear a good support bra to relieve breast tenderness.  Take warm sitz baths to soothe any pain or discomfort caused by hemorrhoids. Use hemorrhoid cream if your health care provider approves.  Rest with your legs elevated if you have leg cramps or low back pain.  If you develop varicose veins in your legs, wear support hose. Elevate your feet for 15 minutes, 3-4 times a day. Limit salt in your diet. Prenatal care  Schedule your prenatal visits by the twelfth week of pregnancy. They  are usually scheduled monthly at first, then more often in the last 2 months before delivery.  Write down your questions. Take them to your prenatal visits.  Keep all your prenatal visits as told by your health care provider. This is important. Safety  Wear your seat belt at all times when driving.  Make a list of emergency phone numbers, including numbers for family, friends, the hospital, and police and fire departments. General instructions  Ask your health care provider for a referral to a local prenatal education class. Begin classes no later than the beginning of month 6 of your pregnancy.  Ask for help if you have counseling or nutritional needs during pregnancy. Your health care provider  can offer advice or refer you to specialists for help with various needs.  Do not use hot tubs, steam rooms, or saunas.  Do not douche or use tampons or scented sanitary pads.  Do not cross your legs for long periods of time.  Avoid cat litter boxes and soil used by cats. These carry germs that can cause birth defects in the baby and possibly loss of the fetus by miscarriage or stillbirth.  Avoid all smoking, herbs, alcohol, and medicines not prescribed by your health care provider. Chemicals in these products affect the formation and growth of the baby.  Do not use any products that contain nicotine or tobacco, such as cigarettes and e-cigarettes. If you need help quitting, ask your health care provider. You may receive counseling support and other resources to help you quit.  Schedule a dentist appointment. At home, brush your teeth with a soft toothbrush and be gentle when you floss. Contact a health care provider if:  You have dizziness.  You have mild pelvic cramps, pelvic pressure, or nagging pain in the abdominal area.  You have persistent nausea, vomiting, or diarrhea.  You have a bad smelling vaginal discharge.  You have pain when you urinate.  You notice increased swelling in your face, hands, legs, or ankles.  You are exposed to fifth disease or chickenpox.  You are exposed to [place] measles (rubella) and have never had it. Get help right away if:  You have a fever.  You are leaking fluid from your vagina.  You have spotting or bleeding from your vagina.  You have severe abdominal cramping or pain.  You have rapid weight gain or loss.  You vomit blood or material that looks like coffee grounds.  You develop a severe headache.  You have shortness of breath.  You have any kind of trauma, such as from a fall or a car accident. Summary  The first trimester of pregnancy is from week 1 until the end of week 13 (months 1 through 3).  Your body goes through  many changes during pregnancy. The changes vary from woman to woman.  You will have routine prenatal visits. During those visits, your health care provider will examine you, discuss any test results you may have, and talk with you about how you are feeling. This information is not intended to replace advice given to you by your health care provider. Make sure you discuss any questions you have with your health care provider. Document Released: 04/15/2001 Document Revised: 04/03/2017 Document Reviewed: 04/02/2016 Elsevier Patient Education  2020 [employer]

## 2019-03-05 LAB — OBSTETRIC PANEL, INCLUDING HIV
Antibody Screen: NEGATIVE
Basophils Absolute: 0 10*3/uL (ref 0.0–0.2)
Basos: 0 %
EOS (ABSOLUTE): 0.2 10*3/uL (ref 0.0–0.4)
Eos: 2 %
HIV Screen 4th Generation wRfx: NONREACTIVE
Hematocrit: 38.1 % (ref 34.0–46.6)
Hemoglobin: 12.3 g/dL (ref 11.1–15.9)
Hepatitis B Surface Ag: NEGATIVE
Immature Grans (Abs): 0 10*3/uL (ref 0.0–0.1)
Immature Granulocytes: 0 %
Lymphocytes Absolute: 2.3 10*3/uL (ref 0.7–3.1)
Lymphs: 23 %
MCH: 28.5 pg (ref 26.6–33.0)
MCHC: 32.3 g/dL (ref 31.5–35.7)
MCV: 88 fL (ref 79–97)
Monocytes Absolute: 1 10*3/uL — ABNORMAL HIGH (ref 0.1–0.9)
Monocytes: 9 %
Neutrophils Absolute: 6.8 10*3/uL (ref 1.4–7.0)
Neutrophils: 66 %
Platelets: 312 10*3/uL (ref 150–450)
RBC: 4.31 x10E6/uL (ref 3.77–5.28)
RDW: 12.3 % (ref 11.7–15.4)
RPR Ser Ql: NONREACTIVE
Rh Factor: NEGATIVE
Rubella Antibodies, IGG: 14.9 index (ref >0.99)
WBC: 10.3 10*3/uL (ref 3.4–10.8)

## 2019-03-06 LAB — CULTURE, OB URINE

## 2019-03-06 LAB — URINE CULTURE, OB REFLEX

## 2019-03-07 DIAGNOSIS — Z6791 Unspecified blood type, Rh negative: Secondary | ICD-10-CM | POA: Insufficient documentation

## 2019-03-07 DIAGNOSIS — O26899 Other specified pregnancy related conditions, unspecified trimester: Secondary | ICD-10-CM | POA: Insufficient documentation

## 2019-03-10 ENCOUNTER — Encounter: Payer: BC Managed Care – PPO | Admitting: Obstetrics and Gynecology

## 2019-03-14 ENCOUNTER — Encounter: Payer: Self-pay | Admitting: General Practice

## 2019-03-17 ENCOUNTER — Encounter: Payer: Self-pay | Admitting: General Practice

## 2019-03-30 ENCOUNTER — Ambulatory Visit (INDEPENDENT_AMBULATORY_CARE_PROVIDER_SITE_OTHER): Payer: BC Managed Care – PPO | Admitting: Obstetrics and Gynecology

## 2019-03-30 ENCOUNTER — Encounter: Payer: Self-pay | Admitting: Obstetrics and Gynecology

## 2019-03-30 ENCOUNTER — Other Ambulatory Visit: Payer: Self-pay

## 2019-03-30 VITALS — BP 125/74 | HR 99 | Temp 97.9°F | Wt 202.2 lb

## 2019-03-30 DIAGNOSIS — Z3A15 15 weeks gestation of pregnancy: Secondary | ICD-10-CM

## 2019-03-30 DIAGNOSIS — N898 Other specified noninflammatory disorders of vagina: Secondary | ICD-10-CM

## 2019-03-30 DIAGNOSIS — Z3402 Encounter for supervision of normal first pregnancy, second trimester: Secondary | ICD-10-CM

## 2019-03-30 DIAGNOSIS — Z3400 Encounter for supervision of normal first pregnancy, unspecified trimester: Secondary | ICD-10-CM

## 2019-03-30 DIAGNOSIS — Z113 Encounter for screening for infections with a predominantly sexual mode of transmission: Secondary | ICD-10-CM

## 2019-03-30 DIAGNOSIS — B9689 Other specified bacterial agents as the cause of diseases classified elsewhere: Secondary | ICD-10-CM

## 2019-03-30 DIAGNOSIS — N760 Acute vaginitis: Secondary | ICD-10-CM

## 2019-03-30 NOTE — Progress Notes (Signed)
   LOW-RISK PREGNANCY OFFICE VISIT Patient name: Sophia King MRN [id number]  Date of birth: 07-27-2000 Chief Complaint:   Routine Prenatal Visit  History of Present Illness:   Sophia King is a 18 y.o. G1P0 female at [redacted]w[redacted]d with an Estimated Date of Delivery: 09/16/19 being seen today for ongoing management of a low-risk pregnancy.  Today she reports no complaints. She is doing self-collection for TOC for trichomonas today. Contractions: Not present. Vag. Bleeding: None.  Movement: Present. denies leaking of fluid. Review of Systems:   Pertinent items are noted in HPI Denies abnormal vaginal discharge w/ itching/odor/irritation, headaches, visual changes, shortness of breath, chest pain, abdominal pain, severe nausea/vomiting, or problems with urination or bowel movements unless otherwise stated above. Pertinent History Reviewed:  Reviewed past medical,surgical, social, obstetrical and family history.  Reviewed problem list, medications and allergies. Physical Assessment:   Vitals:   03/30/19 0841  BP: 125/74  Pulse: 99  Temp: 97.9 F (36.6 C)  Weight: 202 lb 3.2 oz (91.7 kg)  Body mass index is 29.86 kg/m.        Physical Examination:   General appearance: Well appearing, and in no distress  Mental status: Alert, oriented to person, place, and time  Skin: Warm & dry  Cardiovascular: Normal heart rate noted  Respiratory: Normal respiratory effort, no distress  Abdomen: Soft, gravid, nontender  Pelvic: Cervical exam deferred         Extremities: Edema: None  Fetal Status: Fetal Heart Rate (bpm): 164   Movement: Present   RN had difficulty obtaining FHTs with doppler. Informal BS U/S performed -- Patient informed that the ultrasound is considered a limited OB ultrasound and is not intended to be a complete ultrasound exam.  Patient also informed that the ultrasound is not being completed with the intent of assessing for fetal or placental anomalies or any pelvic abnormalities.   Explained that the purpose of today's ultrasound is to assess for viability.  Baby was found to be very active. Patient was happy to see baby moving and doing well. Patient acknowledges the purpose of the exam and the limitations of the study.   Assessment & Plan:  1) Low-risk pregnancy G1P0 at [redacted]w[redacted]d with an Estimated Date of Delivery: 09/16/19   2) Supervision of normal first pregnancy, antepartum  - AFP TETRA,  - Cervicovaginal ancillary only( [place]) - Anatomy U/S scheduled for 04/22/2019 - Discussed OB optimized schedule  3) Screen for STD (sexually transmitted disease) - Cervicovaginal ancillary only( [place]) - Will manage according to results    Meds: No orders of the defined types were placed in this encounter.  Labs/procedures today: none  Plan:  Continue routine obstetrical care   Reviewed: Preterm labor symptoms and general obstetric precautions including but not limited to vaginal bleeding, contractions, leaking of fluid and fetal movement were reviewed in detail with the patient.  All questions were answered. Has home bp cuff. Check bp weekly, let us know if >140/90.   Follow-up: Return in about 6 weeks (around 05/11/2019) for Return OB - My Chart video.  Orders Placed This Encounter  Procedures  . AFP TETRA   Laury Deep MSN, CNM 03/30/2019 9:08 AM

## 2019-03-30 NOTE — Patient Instructions (Signed)
Alpha-Fetoprotein Test Why am I having this test? The alpha-fetoprotein test is most commonly used in pregnant women to help screen for birth defects in their unborn baby. It can be used to screen for birth defects, such as chromosome (DNA) abnormalities, problems with the brain or spinal cord, or problems with the abdominal wall of the unborn baby (fetus). The alpha-fetoprotein test may also be done for men or non-pregnant women to check for certain cancers. What is being tested? This test measures the amount of alpha-fetoprotein (AFP) in your blood. AFP is a protein that is made by the liver. Levels can be detected in the mother's blood during pregnancy, starting at 10 weeks and peaking at 16-18 weeks of the pregnancy. Abnormal levels can sometimes be a sign of a birth defect in the baby. Certain cancers can cause a high level of AFP in men and non-pregnant women. What kind of sample is taken?  A blood sample is required for this test. It is usually collected by inserting a needle into a blood vessel. How are the results reported? Your test results will be reported as values. Your health care provider will compare your results to normal ranges that were established after testing a large group of people (reference values). Reference values may vary among labs and hospitals. For this test, common reference values are:  Adult: Less than 40 ng/mL or less than 40 mcg/L (SI units).  Child younger than 1 year: Less than 30 ng/mL. If you are pregnant, the values may also vary based on how long you have been pregnant. What do the results mean? Results that are above the reference values in pregnant women may indicate the following for the baby:  Neural tube defects, such as abnormalities of the spinal cord or brain.  Abdominal wall defects.  Multiple pregnancy such as twins.  Fetal distress or fetal death. Results that are above the reference values in men or non-pregnant women may indicate:   Reproductive cancers, such as ovarian or testicular cancer.  Liver cancer.  Liver cell death.  Other types of cancer. Very low levels of AFP in pregnant women may indicate the following for the baby:  Down syndrome.  Fetal death. Talk with your health care provider about what your results mean. Questions to ask your health care provider Ask your health care provider, or the department that is doing the test:  When will my results be ready?  How will I get my results?  What are my treatment options?  What other tests do I need?  What are my next steps? Summary  The alpha-fetoprotein test is done on pregnant women to help screen for birth defects in their unborn baby.  Certain cancers can cause a high level of AFP in men and non-pregnant women.  For this test, a blood sample is usually collected by inserting a needle into a blood vessel.  Talk with your health care provider about what your results mean. This information is not intended to replace advice given to you by your health care provider. Make sure you discuss any questions you have with your health care provider. Document Released: 05/15/2004 Document Revised: 04/03/2017 Document Reviewed: 11/25/2016 Elsevier Patient Education  2020 Elsevier Inc.  

## 2019-04-01 ENCOUNTER — Other Ambulatory Visit: Payer: Self-pay | Admitting: Obstetrics and Gynecology

## 2019-04-01 DIAGNOSIS — A5901 Trichomonal vulvovaginitis: Secondary | ICD-10-CM

## 2019-04-01 DIAGNOSIS — A749 Chlamydial infection, unspecified: Secondary | ICD-10-CM

## 2019-04-01 DIAGNOSIS — N760 Acute vaginitis: Secondary | ICD-10-CM

## 2019-04-01 DIAGNOSIS — B9689 Other specified bacterial agents as the cause of diseases classified elsewhere: Secondary | ICD-10-CM

## 2019-04-01 DIAGNOSIS — A599 Trichomoniasis, unspecified: Secondary | ICD-10-CM

## 2019-04-01 DIAGNOSIS — O23592 Infection of other part of genital tract in pregnancy, second trimester: Secondary | ICD-10-CM

## 2019-04-01 LAB — CERVICOVAGINAL ANCILLARY ONLY
Bacterial Vaginitis (gardnerella): POSITIVE — AB
Candida Glabrata: NEGATIVE
Candida Vaginitis: NEGATIVE
Chlamydia: POSITIVE — AB
Comment: NEGATIVE
Comment: NEGATIVE
Comment: NEGATIVE
Comment: NEGATIVE
Comment: NEGATIVE
Comment: NORMAL
Neisseria Gonorrhea: NEGATIVE
Trichomonas: POSITIVE — AB

## 2019-04-01 NOTE — Progress Notes (Signed)
No Rx transmitted d/t no pharmacy on file

## 2019-04-02 LAB — AFP TETRA
DIA Mom Value: 0.66
DIA Value (EIA): 90.69 pg/mL
DSR (By Age)    1 IN: 1155
DSR (Second Trimester) 1 IN: 3042
Gestational Age: 16.4 WEEKS
MSAFP Mom: 0.7
MSAFP: 22.5 ng/mL
MSHCG Mom: 2.27
MSHCG: 69801 m[IU]/mL
Maternal Age At EDD: 20.6 yr
Osb Risk: 10000
T18 (By Age): 1:4499 {titer}
Test Results:: NEGATIVE
Weight: 202 [lb_av]
uE3 Mom: 0.71
uE3 Value: 0.68 ng/mL

## 2019-04-04 MED ORDER — METRONIDAZOLE 500 MG PO TABS: 2000.0000 mg | ORAL_TABLET | Freq: Once | ORAL | 1 refills | Status: AC

## 2019-04-04 MED ORDER — AZITHROMYCIN 500 MG PO TABS: 1000.0000 mg | ORAL_TABLET | Freq: Every day | ORAL | 1 refills | Status: AC

## 2019-04-04 NOTE — Telephone Encounter (Signed)
-----   Message from Laury Deep, [place] sent at 04/01/2019  4:03 PM EST ----- There is no pharmacy on file. Please treat for CT, Trich & BV.

## 2019-04-05 ENCOUNTER — Encounter: Payer: Self-pay | Admitting: General Practice

## 2019-04-22 ENCOUNTER — Ambulatory Visit (HOSPITAL_COMMUNITY)
Admission: RE | Admit: 2019-04-22 | Discharge: 2019-04-22 | Disposition: A | Discharge status: Home / Self Care | Payer: BC Managed Care – PPO | Source: Ambulatory Visit | Attending: Obstetrics and Gynecology | Admitting: Obstetrics and Gynecology

## 2019-04-22 ENCOUNTER — Ambulatory Visit (HOSPITAL_COMMUNITY): Payer: BC Managed Care – PPO | Admitting: *Deleted

## 2019-04-22 ENCOUNTER — Other Ambulatory Visit: Payer: Self-pay

## 2019-04-22 ENCOUNTER — Encounter (HOSPITAL_COMMUNITY): Payer: Self-pay

## 2019-04-22 ENCOUNTER — Other Ambulatory Visit (HOSPITAL_COMMUNITY): Payer: Self-pay | Admitting: *Deleted

## 2019-04-22 ENCOUNTER — Ambulatory Visit (HOSPITAL_BASED_OUTPATIENT_CLINIC_OR_DEPARTMENT_OTHER): Payer: BC Managed Care – PPO | Admitting: Genetic Counselor

## 2019-04-22 DIAGNOSIS — Z148 Genetic carrier of other disease: Secondary | ICD-10-CM | POA: Insufficient documentation

## 2019-04-22 DIAGNOSIS — Z3400 Encounter for supervision of normal first pregnancy, unspecified trimester: Secondary | ICD-10-CM | POA: Insufficient documentation

## 2019-04-22 DIAGNOSIS — D563 Thalassemia minor: Secondary | ICD-10-CM

## 2019-04-22 DIAGNOSIS — O43192 Other malformation of placenta, second trimester: Secondary | ICD-10-CM | POA: Diagnosis not present

## 2019-04-22 DIAGNOSIS — O26899 Other specified pregnancy related conditions, unspecified trimester: Secondary | ICD-10-CM | POA: Diagnosis present

## 2019-04-22 DIAGNOSIS — Z3A19 19 weeks gestation of pregnancy: Secondary | ICD-10-CM

## 2019-04-22 DIAGNOSIS — O43199 Other malformation of placenta, unspecified trimester: Secondary | ICD-10-CM

## 2019-04-22 DIAGNOSIS — Z315 Encounter for genetic counseling: Secondary | ICD-10-CM | POA: Diagnosis not present

## 2019-04-22 DIAGNOSIS — Z6791 Unspecified blood type, Rh negative: Secondary | ICD-10-CM | POA: Diagnosis present

## 2019-04-22 NOTE — Progress Notes (Signed)
04/22/2019  Deeann Dowse Dec 11, 2000 MRN: [id number] DOV: 04/22/2019  Ms. 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 Ms. Kiesel came to her appointment alone due to COVID-19 visitor restrictions.   Indication for genetic counseling - Silent carrier for alpha-thalassemia - Increased risk to be silent (2+0) carrier for spinal muscular atrophy  Prenatal history  Ms. Jansson is a G58P0, 18 y.o. female. Her current pregnancy has completed [redacted]w[redacted]d(Estimated Date of Delivery: 09/16/19).  Ms. EWickeydenied exposure to environmental toxins or chemical agents. She denied the use of alcohol, tobacco or street drugs. She denied significant viral illnesses, fevers, and bleeding during the course of her pregnancy. Her medical and surgical histories were noncontributory.  Family History  A three generation pedigree was drafted and reviewed. Both family histories were reviewed and found to be noncontributory for birth defects, intellectual disability, recurrent pregnancy loss, and known genetic conditions. Ms. EOlahad limited information about her partner's paternal family history; thus, risk assessment was limited.  The patient's ethnicity is African American. The father of the pregnancy's ethnicity is African American. Ashkenazi Jewish ancestry and consanguinity were denied. Pedigree will be scanned under Media.  Discussion  Ms. Hiers had Horizon-14 carrier screening performed through [facility] The results of the screen identified her as a silent carrier for alpha-thalassemia (aa/a-). Alpha-thalassemia is different in its inheritance compared to other hemoglobinopathies as there are two copies of two alpha globin genes (HBA1 and HBA2) on each chromosome 16, or four alpha globin genes total (aa/aa). A person can be a carrier of one alpha gene mutation (aa/a-), also referred to as  a "silent carrier". A person who carries two alpha globin gene mutations can either carry them in cis (both on the same chromosome, denoted as aa/--) or in trans (on different chromosomes, denoted as a-/a-). Alpha-thalassemia carriers of two mutations who have African American ancestry are more likely to have a trans arrangement (a-/a-); cis configuration is reported to be rare in individuals with African American ancestry.     There are several different forms of alpha-thalassemia. The most severe form of alpha-thalassemia, Hb Barts, is associated with an absence of alpha globin chain synthesis as a result of deletions of all four alpha globin genes (--/--).  Given that Ms. EFellingis a silent carrier (aa/a-), her pregnancies would not be at increased risk for Hb Barts, even if her partner is a carrier for alpha-thalassemia, as she will always pass on at least one copy of the alpha globin gene to her children. Hemoglobin H (HbH) disease is caused by three deleted or dysfunctioning alpha globin alleles (a-/--) and is characterized by microcytic hypochromic hemolytic anemia, hepatosplenomegaly, mild jaundice, growth retardation, and sometimes thalassemia-like bone changes. Given Ms. Kitko's silent carrier status (aa/a-), the current fetus would only be at risk for HbH disease (a-/--), if her partner is a carrier for two alpha globin mutations in cis (aa/--). If this is the case, the risk for HbH disease in the pregnancy would be 1 in 4 (25%). However, if Ms. Gonce's partner is a carrier for two alpha globin mutations, he would be more likely to carry them in trans configuration (a-/a-) than the cis configuration (aa/--), given his ethnicity. If he is a carrier of alpha-thalassemia in trans, then the pregnancy would not be at increased risk for HbH disease. Based on the carrier frequency for alpha-thalassemia in the African American population,  Ms. Schmuck partner has a 1 in 30 chance of being any type of carrier  for alpha-thalassemia.   Ms. Ouellet was also found to have 2 copies of the SMN1 gene on Horizon-14 carrier screening; however, she also has the c.*3+80T>G polymorphism of SMN1 in intron 7 (also known as [id number]>G). This puts her at increased risk (1 in 79) to be a silent 2+0 carrier for spinal muscular atrophy (SMA). SMA is a condition caused by mutations in the SMN1 gene. Typically, individuals have two copies of the SMN1gene, with one copy present on each chromosome. In SMA silent carriers, both copies of the SMN1gene are present on one chromosome, with no copies of SMN1present on the other chromosome.  SMA is characterized by progressive muscle weakness and atrophy due to degeneration and loss of anterior horn cells (lower motor neurons) in the spinal cord and brain stem. We discussed the different types of SMA (0, I, II, and III), including differences in severity and age of onset. We also reviewed the autosomal recessive inheritance pattern of SMA. Based on the carrier frequency for SMA in the African American population, Ms. Attaway's partner currently has a 1 in 31 chance of being a carrier of SMA. If he were found to have 2 copies of SMN1, his risk of being a carrier is reduced but not eliminated. If both parents are carriers of SMA, there is a 25% chance of having an affected fetus.   Ms. Sobel carrier screening was negative for the other 12 conditions screened. Thus, her risk to be a carrier for these additional conditions (listed separately in the laboratory report) has been reduced but not eliminated. This also significantly reduces her risk of having a child affected by one of these conditions. We discussed that carrier testing for alpha-thalassemia and SMA is recommended for Ms. Mcglasson's partner. Ms. Schmuhl indicated that she is interested in pursuing partner carrier screening.  We also reviewed that Ms. Umscheid had Panorama NIPS through the laboratory [facility] that was low-risk for fetal  aneuploidies. We reviewed that these results showed a less than 1 in 10,000 risk for trisomies 21, 18 and 13, and monosomy X (Turner syndrome).  In addition, the risk for triploidy and sex chromosome trisomies (47,XXX and 47,XXY) was also low. Ms. 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 We reviewed that while this testing identifies 94-99% of pregnancies with trisomy 52, trisomy 57, trisomy 74, sex chromosome aneuploidies, and triploidy, it is NOT diagnostic. A positive test result requires confirmation by CVS or amniocentesis, and a negative test result does not rule out a fetal chromosome abnormality. She also understands that this testing does not identify all genetic conditions.  A complete ultrasound was performed today prior to our visit. The ultrasound report will be sent under separate cover. There were no visualized fetal anomalies or markers suggestive of aneuploidy.  Ms. Fielder was also counseled regarding diagnostic testing via amniocentesis. We discussed the technical aspects of the procedure and quoted up to a 1 in 500 (0.2%) risk for spontaneous pregnancy loss or other adverse pregnancy outcomes as a result of amniocentesis. Cultured cells from an amniocentesis sample allow for the visualization of a fetal karyotype, which can detect >99% of chromosomal aberrations. Chromosomal microarray can also be performed to identify smaller deletions or duplications of fetal chromosomal material. Amniocentesis could also be performed to assess whether the baby is affected by alpha-thalassemia or SMA. After careful consideration, Ms.  Maricle declined amniocentesis at this time. She understands that amniocentesis is available at any point after 16 weeks of pregnancy and that she may opt to undergo the procedure at a later date should she change her mind.  Ms. Amrhein indicated that she is interested in pursuing alpha-thalassemia and SMA carrier  screening for her partner, Kathie Rhodes. However, Ms. Foulkes was unsure about his health insurance coverage. We discussed that if [facility] not have insurance, I could potentially facilitate free testing through the laboratory [facility]'s Patient Assistance Program. I instructed Ms. Hennick to contact me once she has had an opportunity to discuss carrier screening withher partner.I can send him a saliva kit and place the orderonce I hear from her. Results will take 2-3 weeks to be delivered from the time the laboratory receives the sample. I will call Ms.Edelenwhen results become available.  I counseled Ms. Lockyer regarding the above risks and available options. The approximate face-to-face time with the genetic counselor was 20 minutes.  In summary:  Discussed carrier screening results and options for follow-up testing  Silent carrier for alpha-thalassemia  Increased risk to be silent carrier for spinal muscular atrophy  Desires partner carrier screening. She will contact me about her partner's insurance status and I will facilitate testing from there  Reviewed low-risk NIPS result  Reduction in risk for Down syndrome,trisomy 18,trisomy 65, sex chromosome aneuploidies, and 22q11.2 deletion  Reviewed results of ultrasound  No fetal anomalies or markers seen  Reduction in risk for fetal aneuploidy  Offered additional testing and screening  Declined amniocentesis  Reviewed family history concerns   Buelah Manis, [place]

## 2019-04-26 ENCOUNTER — Encounter: Payer: Self-pay | Admitting: Obstetrics & Gynecology

## 2019-04-26 DIAGNOSIS — O289 Unspecified abnormal findings on antenatal screening of mother: Secondary | ICD-10-CM | POA: Insufficient documentation

## 2019-04-27 ENCOUNTER — Ambulatory Visit: Payer: BC Managed Care – PPO

## 2019-05-02 ENCOUNTER — Encounter: Payer: Self-pay | Admitting: Obstetrics & Gynecology

## 2019-05-02 DIAGNOSIS — O43199 Other malformation of placenta, unspecified trimester: Secondary | ICD-10-CM | POA: Insufficient documentation

## 2019-05-05 ENCOUNTER — Encounter: Payer: Self-pay | Admitting: General Practice

## 2019-05-05 ENCOUNTER — Ambulatory Visit (INDEPENDENT_AMBULATORY_CARE_PROVIDER_SITE_OTHER): Payer: BC Managed Care – PPO | Admitting: Student

## 2019-05-05 ENCOUNTER — Other Ambulatory Visit: Payer: Self-pay

## 2019-05-05 VITALS — BP 112/73 | HR 106 | Temp 97.6°F | Wt 213.4 lb

## 2019-05-05 DIAGNOSIS — Z3A20 20 weeks gestation of pregnancy: Secondary | ICD-10-CM

## 2019-05-05 DIAGNOSIS — Z3400 Encounter for supervision of normal first pregnancy, unspecified trimester: Secondary | ICD-10-CM

## 2019-05-05 DIAGNOSIS — Z113 Encounter for screening for infections with a predominantly sexual mode of transmission: Secondary | ICD-10-CM

## 2019-05-05 DIAGNOSIS — N898 Other specified noninflammatory disorders of vagina: Secondary | ICD-10-CM

## 2019-05-05 DIAGNOSIS — Z3402 Encounter for supervision of normal first pregnancy, second trimester: Secondary | ICD-10-CM

## 2019-05-05 NOTE — Patient Instructions (Signed)

## 2019-05-05 NOTE — Progress Notes (Signed)
   PRENATAL VISIT NOTE  Subjective:  Sophia King is a 18 y.o. G1P0 at [redacted]w[redacted]d being seen today for ongoing prenatal care.  She is currently monitored for the following issues for this low-risk pregnancy and has Supervision of normal first pregnancy, antepartum; Trichomoniasis of vagina; Rh negative state in antepartum period; Abnormal antenatal test; and Marginal insertion of umbilical cord affecting management of mother on their problem list.  Patient reports no complaints.  Contractions: Not present. Vag. Bleeding: None.  Movement: Present. Denies leaking of fluid.   The following portions of the patient's history were reviewed and updated as appropriate: allergies, current medications, past family history, past medical history, past social history, past surgical history and problem list.   Objective:   Vitals:   05/05/19 0957  BP: 112/73  Pulse: (!) 106  Temp: 97.6 F (36.4 C)  Weight: 213 lb 6.4 oz (96.8 kg)    Fetal Status: Fetal Heart Rate (bpm): 160   Movement: Present    Fundal height at umbilicus  General:  Alert, oriented and cooperative. Patient is in no acute distress.  Skin: Skin is warm and dry. No rash noted.   Cardiovascular: Normal heart rate noted  Respiratory: Normal respiratory effort, no problems with respiration noted  Abdomen: Soft, gravid, appropriate for gestational age.  Pain/Pressure: Absent     Pelvic: Cervical exam deferred        Extremities: Normal range of motion.  Edema: None  Mental Status: Normal mood and affect. Normal behavior. Normal judgment and thought content.   Assessment and Plan:  Pregnancy: G1P0 at [redacted]w[redacted]d 1. Supervision of normal first pregnancy, antepartum -doing well. No complaints -s/p genetic counseling. FOB lives in [place] & will be in town for the holiday. Will make decision regarding his carrier testing while he's in town. Patient knows to contact the [facility] if he chooses to be tested.   2. Screen for STD (sexually transmitted  disease) -has not had intercourse since last trichomonas treatment. Partner was treated as well.  - Cervicovaginal ancillary only( [place])   Preterm labor symptoms and general obstetric precautions including but not limited to vaginal bleeding, contractions, leaking of fluid and fetal movement were reviewed in detail with the patient. Please refer to After Visit Summary for other counseling recommendations.   Return in about 8 weeks (around 06/30/2019) for Routine OB, fasting labs.  Future Appointments  Date Time Provider [place]  05/20/2019 10:40 AM [place] NURSE [place] [facility]  05/20/2019 10:45 AM [place] 2 [facility]  06/02/2019 10:30 AM Laury Deep, CNM [facility] None  06/29/2019  8:10 AM Jorje Guild, NP [facility] None    Jorje Guild, NP

## 2019-05-09 ENCOUNTER — Ambulatory Visit (HOSPITAL_COMMUNITY): Payer: Self-pay | Admitting: Obstetrics & Gynecology

## 2019-05-12 LAB — CERVICOVAGINAL ANCILLARY ONLY
Bacterial Vaginitis (gardnerella): NEGATIVE
Candida Glabrata: NEGATIVE
Candida Vaginitis: NEGATIVE
Chlamydia: NEGATIVE
Comment: NEGATIVE
Comment: NEGATIVE
Comment: NEGATIVE
Comment: NEGATIVE
Comment: NEGATIVE
Comment: NORMAL
Neisseria Gonorrhea: NEGATIVE
Trichomonas: NEGATIVE

## 2019-05-20 ENCOUNTER — Ambulatory Visit (HOSPITAL_COMMUNITY): Payer: Medicaid Other

## 2019-05-20 ENCOUNTER — Encounter (HOSPITAL_COMMUNITY): Payer: Self-pay

## 2019-05-20 ENCOUNTER — Ambulatory Visit (HOSPITAL_COMMUNITY): Admission: RE | Admit: 2019-05-20 | Payer: Medicaid Other | Source: Ambulatory Visit

## 2019-06-02 ENCOUNTER — Other Ambulatory Visit: Payer: Self-pay

## 2019-06-02 ENCOUNTER — Telehealth: Payer: BC Managed Care – PPO | Admitting: Obstetrics and Gynecology

## 2019-06-29 ENCOUNTER — Encounter: Payer: BC Managed Care – PPO | Admitting: Student

## 2019-09-15 MED ORDER — BENZOCAINE-MENTHOL 20-0.5 % EX AERO
1.00 | INHALATION_SPRAY | CUTANEOUS | Status: DC
Start: ? — End: 2019-09-15

## 2019-09-15 MED ORDER — DSS 100 MG PO CAPS
100.00 | ORAL_CAPSULE | ORAL | Status: DC
Start: 2019-09-15 — End: 2019-09-15

## 2019-09-15 MED ORDER — HYDROCORTISONE 1 % EX CREA
1.00 | TOPICAL_CREAM | CUTANEOUS | Status: DC
Start: ? — End: 2019-09-15

## 2019-09-15 MED ORDER — BISACODYL 10 MG RE SUPP
10.00 | RECTAL | Status: DC
Start: ? — End: 2019-09-15

## 2019-09-15 MED ORDER — ACETAMINOPHEN 325 MG PO TABS
650.00 | ORAL_TABLET | ORAL | Status: DC
Start: ? — End: 2019-09-15

## 2019-09-15 MED ORDER — CALCIUM CARBONATE 1250 (500 CA) MG PO CHEW
CHEWABLE_TABLET | ORAL | Status: DC
Start: ? — End: 2019-09-15

## 2019-09-15 MED ORDER — IBUPROFEN 800 MG PO TABS
800.00 | ORAL_TABLET | ORAL | Status: DC
Start: ? — End: 2019-09-15

## 2019-09-15 MED ORDER — PETROLATUM EX OINT
1.00 | TOPICAL_OINTMENT | CUTANEOUS | Status: DC
Start: ? — End: 2019-09-15

## 2019-09-15 MED ORDER — DIPHENHYDRAMINE HCL 25 MG PO CAPS
25.00 | ORAL_CAPSULE | ORAL | Status: DC
Start: ? — End: 2019-09-15

## 2019-09-15 MED ORDER — PNV PRENATAL PLUS MULTIVITAMIN 27-1 MG PO TABS
1.00 | ORAL_TABLET | ORAL | Status: DC
Start: 2019-09-16 — End: 2019-09-15

## 2019-09-15 MED ORDER — ZOLPIDEM TARTRATE 5 MG PO TABS
5.00 | ORAL_TABLET | ORAL | Status: DC
Start: ? — End: 2019-09-15

## 2019-09-15 MED ORDER — LACTATED RINGERS IV SOLN
125.00 | INTRAVENOUS | Status: DC
Start: ? — End: 2019-09-15

## 2020-06-27 IMAGING — US US OB COMP LESS 14 WK
1 series · 16 of 24 positions shown · non-contrast
Comparison: None

CLINICAL DATA: Pink spotting.

EXAM:
OBSTETRIC <14 WK ULTRASOUND
TECHNIQUE: Transabdominal ultrasound was performed for evaluation of the
gestation as well as the maternal uterus and adnexal regions.

[Series 1: us ob comp less 14 wk · 24 acquisitions, 16 frames shown]
[im 1/24]
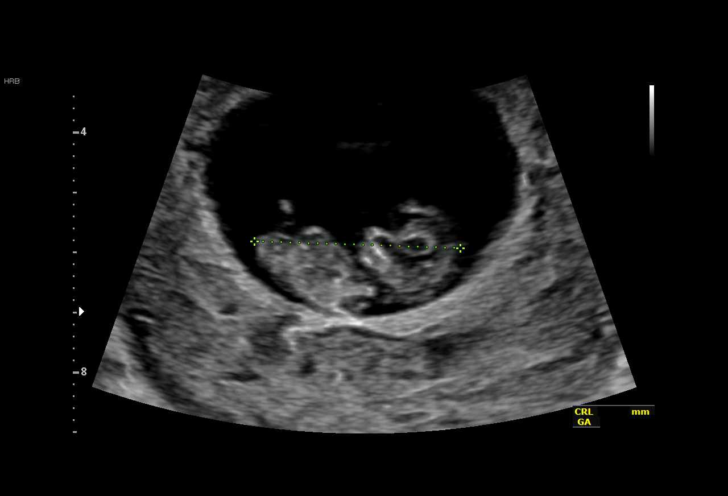
[im 3/24]
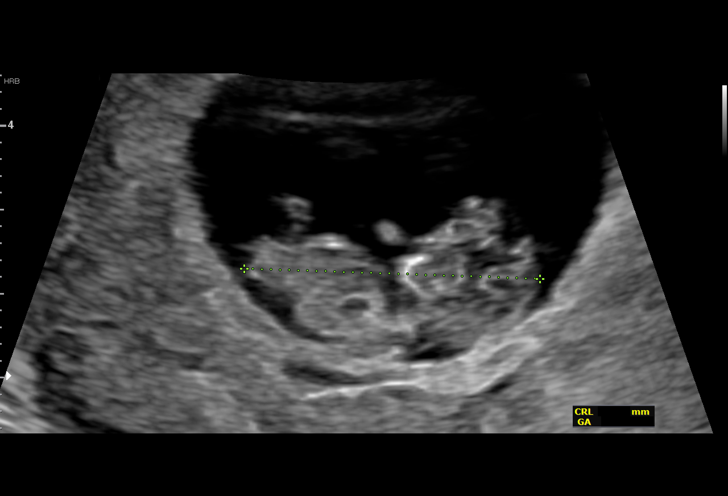
[im 4/24]
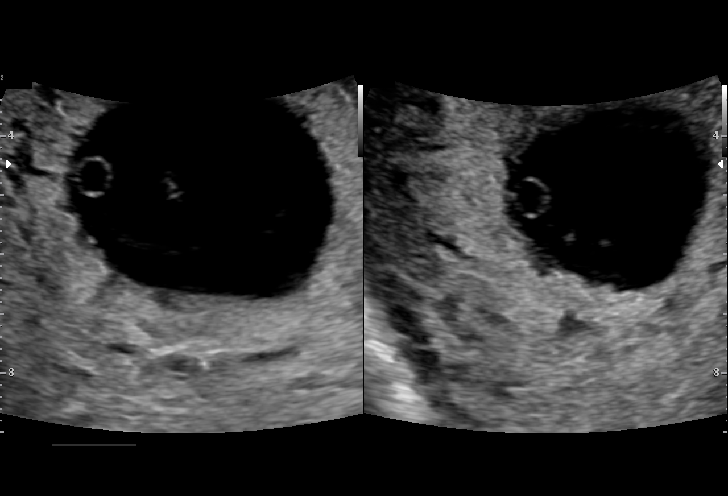
[im 6/24]
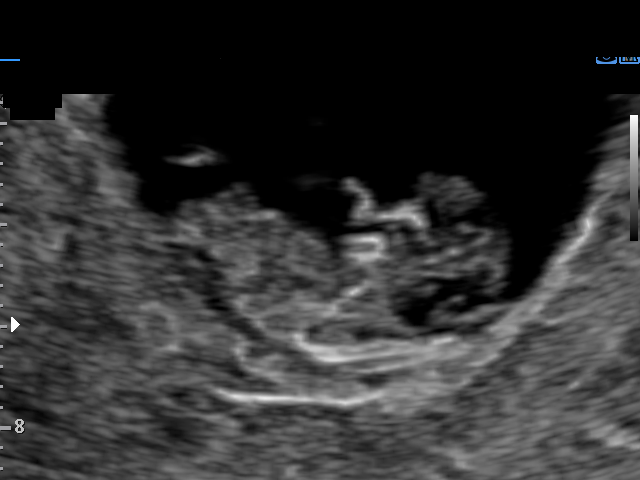
[im 7/24]
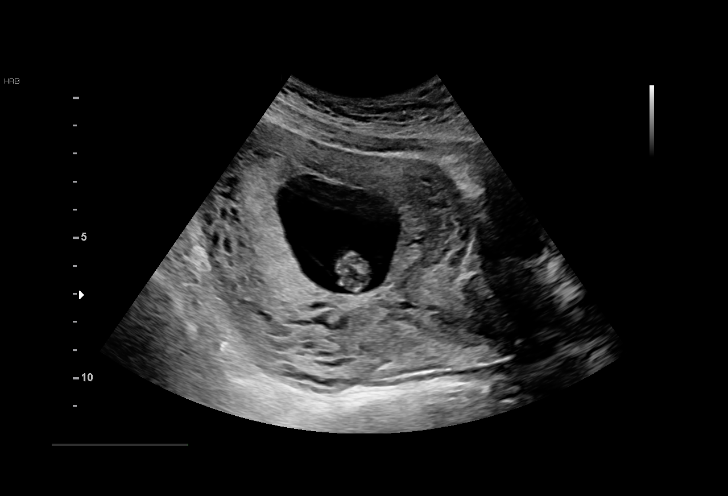
[im 9/24]
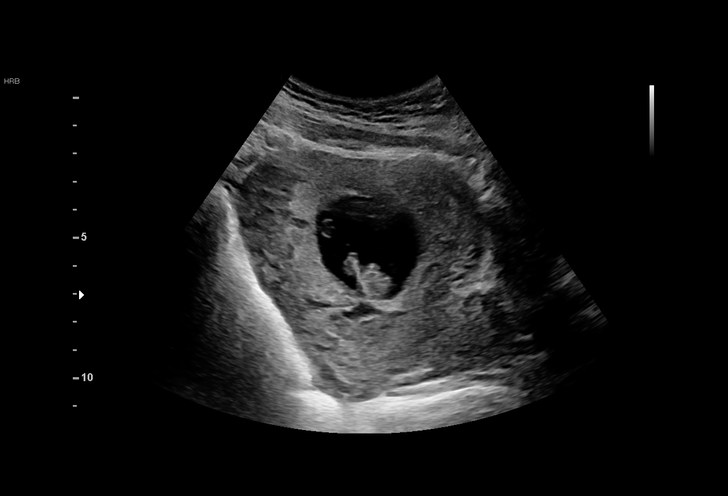
[im 10/24]
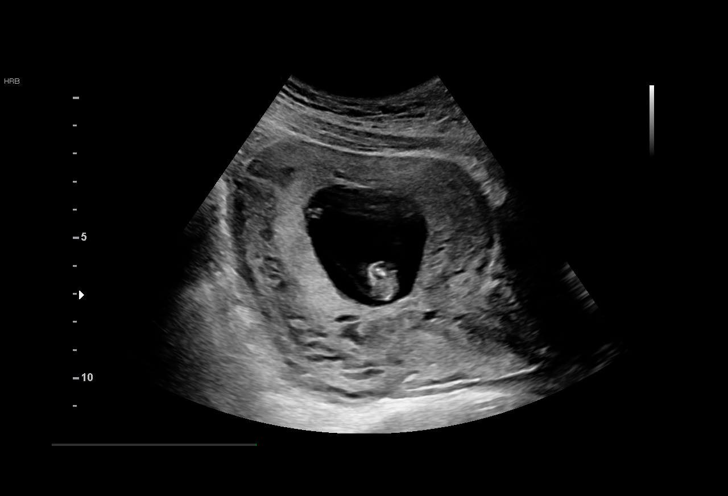
[im 12/24]
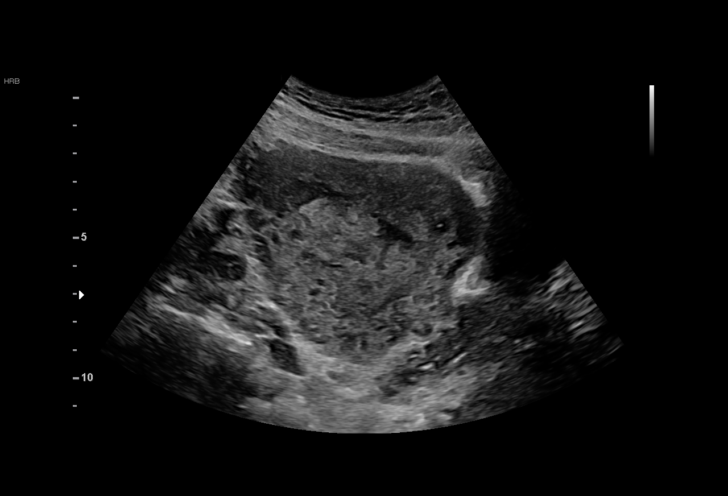
[im 13/24]
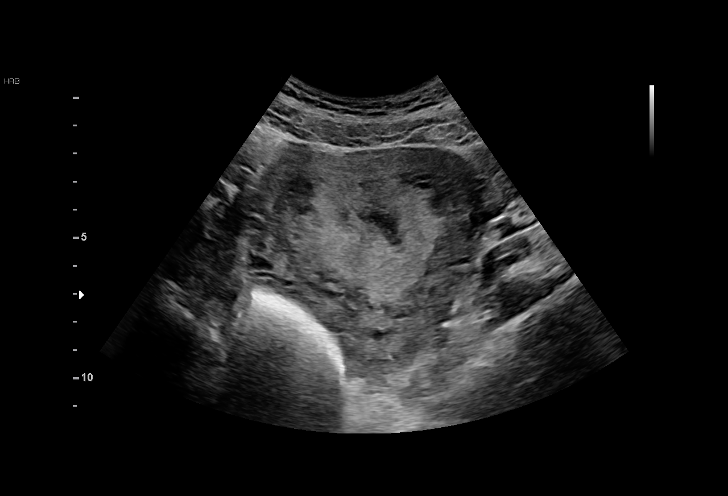
[im 15/24]
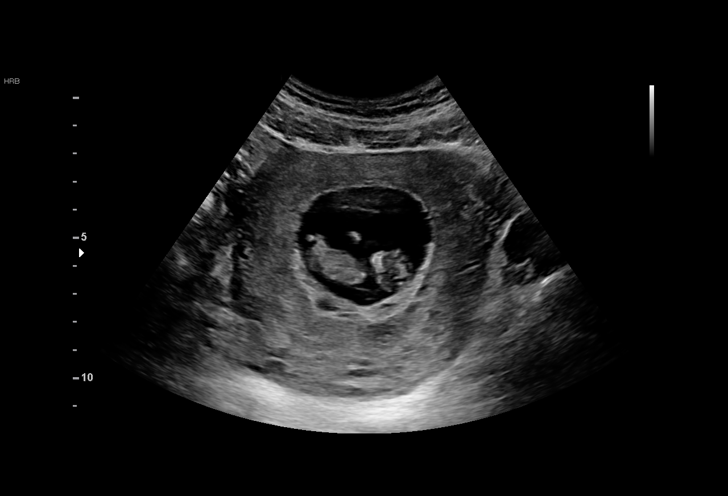
[im 16/24]
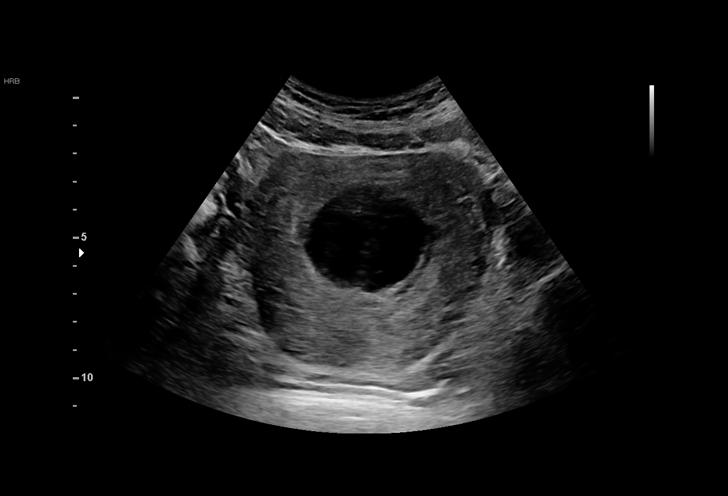
[im 18/24]
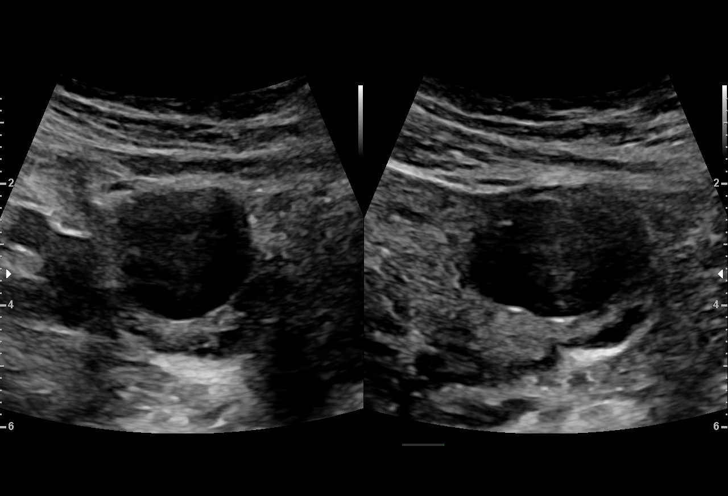
[im 19/24]
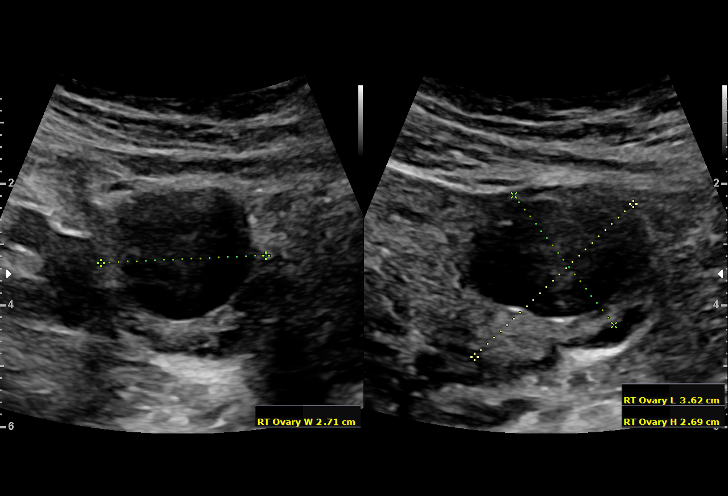
[im 21/24]
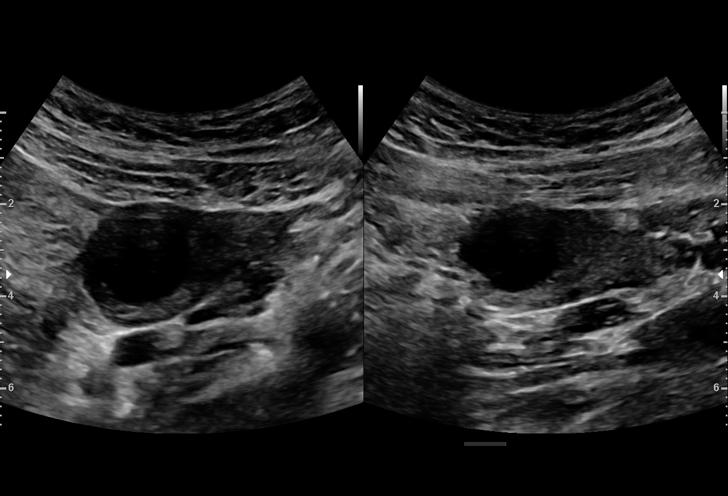
[im 22/24]
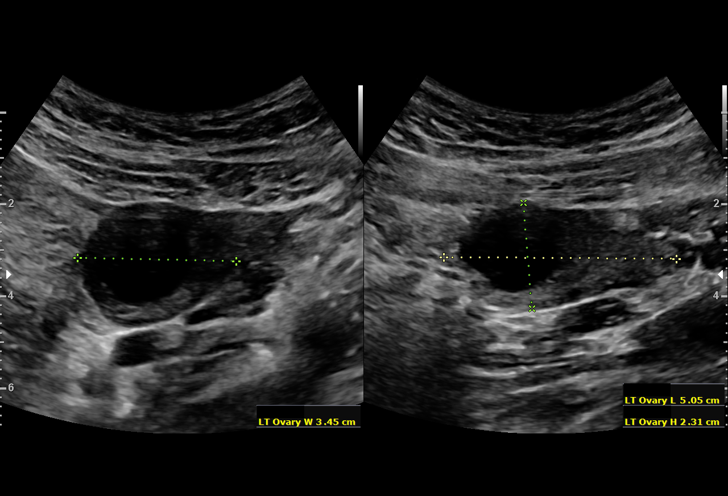
[im 24/24]
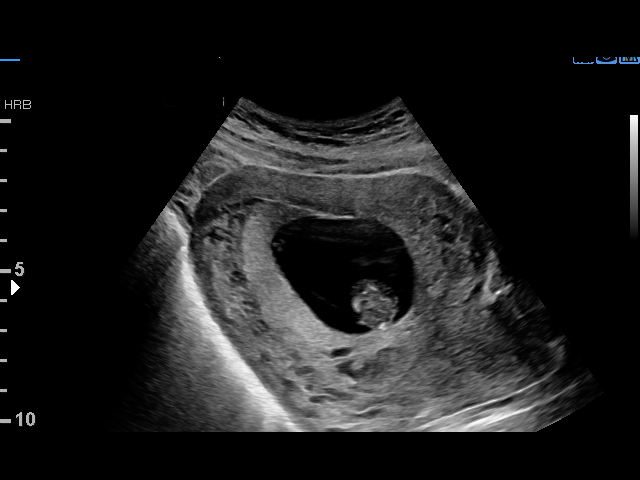

[16 of 24 positions shown; findings below may reference images not displayed]

FINDINGS: Intrauterine gestational sac: Single

Yolk sac:  Visualized.

Embryo:  Visualized.

Cardiac Activity: Visualized.

Heart Rate: 176 bpm

CRL:   36.4 mm   10 w 4 d                  US EDC: 09/16/2019

Subchorionic hemorrhage:  None visualized.

Maternal uterus/adnexae: Bilateral ovarian cysts.
IMPRESSION: Single viable intrauterine pregnancy at 10 weeks 4 days by
crown-rump length.

## 2022-03-10 ENCOUNTER — Ambulatory Visit: Payer: Medicaid Other | Admitting: Family Medicine

## 2022-04-24 ENCOUNTER — Ambulatory Visit: Payer: Medicaid Other | Admitting: Family Medicine

## 2022-05-13 ENCOUNTER — Ambulatory Visit: Payer: No Typology Code available for payment source | Admitting: Nurse Practitioner

## 2022-05-13 ENCOUNTER — Encounter: Payer: Self-pay | Admitting: Nurse Practitioner

## 2022-05-13 ENCOUNTER — Other Ambulatory Visit (HOSPITAL_COMMUNITY)
Admission: RE | Admit: 2022-05-13 | Discharge: 2022-05-13 | Disposition: A | Discharge status: Home / Self Care | Payer: No Typology Code available for payment source | Source: Ambulatory Visit | Attending: Nurse Practitioner | Admitting: Nurse Practitioner

## 2022-05-13 VITALS — BP 104/72 | HR 74 | Temp 98.1°F | Ht 69.0 in | Wt 194.4 lb

## 2022-05-13 DIAGNOSIS — Z1159 Encounter for screening for other viral diseases: Secondary | ICD-10-CM

## 2022-05-13 DIAGNOSIS — Z3041 Encounter for surveillance of contraceptive pills: Secondary | ICD-10-CM | POA: Diagnosis not present

## 2022-05-13 DIAGNOSIS — Z0000 Encounter for general adult medical examination without abnormal findings: Secondary | ICD-10-CM

## 2022-05-13 DIAGNOSIS — Z23 Encounter for immunization: Secondary | ICD-10-CM | POA: Diagnosis not present

## 2022-05-13 DIAGNOSIS — Z6828 Body mass index (BMI) 28.0-28.9, adult: Secondary | ICD-10-CM

## 2022-05-13 DIAGNOSIS — Z113 Encounter for screening for infections with a predominantly sexual mode of transmission: Secondary | ICD-10-CM

## 2022-05-13 DIAGNOSIS — Z114 Encounter for screening for human immunodeficiency virus [HIV]: Secondary | ICD-10-CM | POA: Diagnosis not present

## 2022-05-13 DIAGNOSIS — E7800 Pure hypercholesterolemia, unspecified: Secondary | ICD-10-CM | POA: Insufficient documentation

## 2022-05-13 DIAGNOSIS — Z7689 Persons encountering health services in other specified circumstances: Secondary | ICD-10-CM

## 2022-05-13 DIAGNOSIS — Z789 Other specified health status: Secondary | ICD-10-CM | POA: Insufficient documentation

## 2022-05-13 MED ORDER — LO LOESTRIN FE 1 MG-10 MCG / 10 MCG PO TABS: 1.0000 | ORAL_TABLET | Freq: Every day | ORAL | 3 refills | Status: DC

## 2022-05-13 NOTE — Progress Notes (Signed)
I,Sophia King,acting as a Neurosurgeon for Sophia Felts, FNP.,have documented all relevant documentation on the behalf of Sophia Felts, FNP,as directed by  Sophia Felts, FNP while in the presence of Sophia Felts, FNP.   Subjective:     Patient ID: Sophia King , female    DOB: [birth year] , 22 y.o.   MRN: [id number]   Chief Complaint  Patient presents with   Establish Care   Annual Exam    HPI  Pt presents today to est care. She found [place] online. She works as a Sports coach at [employer]. Single. One son who is 76 years old - healthy.   She does not have any specific questions or concerns, willing to complete physical today.   Previous pcp: Caffie Damme at [facility] in [place] last time in October 2023. She relocated back to [place]. Her last visit she had her cholesterol levels checked. She is not on any cholesterol medications.  She does not currently have a GYN.  LMP 05/10/2022 - she is regular  She would like to complete pap on a later date.  [facility] in [place]     Past Medical History:  Diagnosis Date   Medical history non-contributory      History reviewed. No pertinent family history.   Current Outpatient Medications:    Norethindrone-Ethinyl Estradiol-Fe Biphas (LO LOESTRIN FE) 1 MG-10 MCG / 10 MCG tablet, Take 1 tablet by mouth daily., Disp: 84 tablet, Rfl: 3   No Known Allergies    The patient states she uses OCP (estrogen/progesterone) for birth control.  Patient's last menstrual period was 05/10/2021 (exact date).. Negative for Dysmenorrhea. Negative for: breast discharge, breast lump(s), breast pain and breast self exam. Associated symptoms include abnormal vaginal bleeding. Pertinent negatives include abnormal bleeding (hematology), anxiety, decreased libido, depression, difficulty falling sleep, dyspareunia, history of infertility, nocturia, sexual dysfunction, sleep disturbances, urinary incontinence, urinary  urgency, vaginal discharge and vaginal itching. Diet regular. The patient states her exercise level is minimal 2 days a week.    The patient's tobacco use is:  Social History   Tobacco Use  Smoking Status Never  Smokeless Tobacco Never   She has been exposed to passive smoke. The patient's alcohol use is:  Social History   Substance and Sexual Activity  Alcohol Use Yes   Comment: socially  Additional information: Last pap unknown, next one scheduled for will reschedule.    Review of Systems  Constitutional: Negative.   HENT: Negative.    Eyes: Negative.   Respiratory: Negative.    Cardiovascular: Negative.   Gastrointestinal: Negative.   Endocrine: Negative.   Genitourinary: Negative.   Musculoskeletal: Negative.   Skin: Negative.   Allergic/Immunologic: Negative.   Neurological: Negative.   Hematological: Negative.   Psychiatric/Behavioral: Negative.       Today's Vitals   05/13/22 1146  BP: 104/72  Pulse: 74  Temp: 98.1 F (36.7 C)  SpO2: 98%  Weight: 194 lb 6.4 oz (88.2 kg)  Height: 5\' 9"  (1.753 m)   Body mass index is 28.71 kg/m.  Wt Readings from Last 3 Encounters:  05/13/22 194 lb 6.4 oz (88.2 kg)  05/05/19 213 lb 6.4 oz (96.8 kg)  03/30/19 202 lb 3.2 oz (91.7 kg)    Objective:  Physical Exam Vitals reviewed.  Constitutional:      General: She is not in acute distress.    Appearance: Normal appearance. She is well-developed. She is obese.  HENT:  Head: Normocephalic and atraumatic.     Right Ear: Hearing, tympanic membrane, ear canal and external ear normal. There is no impacted cerumen.     Left Ear: Hearing, tympanic membrane, ear canal and external ear normal. There is no impacted cerumen.     Nose:     Comments: Deferred - masked    Mouth/Throat:     Comments: Deferred - masked Eyes:     General: Lids are normal.     Extraocular Movements: Extraocular movements intact.     Conjunctiva/sclera: Conjunctivae normal.     Pupils: Pupils  are equal, round, and reactive to light.     Funduscopic exam:    Right eye: No papilledema.        Left eye: No papilledema.  Neck:     Thyroid: No thyroid mass.     Vascular: No carotid bruit.  Cardiovascular:     Rate and Rhythm: Normal rate and regular rhythm.     Pulses: Normal pulses.     Heart sounds: Normal heart sounds. No murmur heard. Pulmonary:     Effort: Pulmonary effort is normal. No respiratory distress.     Breath sounds: Normal breath sounds. No wheezing.  Chest:     Chest wall: No mass.  Breasts:    Tanner Score is 5.     Right: Normal. No mass or tenderness.     Left: Normal. No mass or tenderness.  Abdominal:     General: Abdomen is flat. Bowel sounds are normal. There is no distension.     Palpations: Abdomen is soft.     Tenderness: There is no abdominal tenderness.  Genitourinary:    Comments: Deferred  Musculoskeletal:        General: No swelling. Normal range of motion.     Cervical back: Full passive range of motion without pain, normal range of motion and neck supple.     Right lower leg: No edema.     Left lower leg: No edema.  Lymphadenopathy:     Upper Body:     Right upper body: No supraclavicular, axillary or pectoral adenopathy.     Left upper body: No supraclavicular, axillary or pectoral adenopathy.  Skin:    General: Skin is warm and dry.     Capillary Refill: Capillary refill takes less than 2 seconds.  Neurological:     General: No focal deficit present.     Mental Status: She is alert and oriented to person, place, and time.     Cranial Nerves: No cranial nerve deficit.     Sensory: No sensory deficit.     Motor: No weakness.  Psychiatric:        Mood and Affect: Mood normal.        Behavior: Behavior normal.        Thought Content: Thought content normal.        Judgment: Judgment normal.         Assessment And Plan:     1. Encounter for annual health examination Behavior modifications discussed and diet history  reviewed.   Pt will continue to exercise regularly and modify diet with low GI, plant based foods and decrease intake of processed foods.  Recommend intake of daily multivitamin, Vitamin D, and calcium.  Recommend for preventive screenings, as well as recommend immunizations that include influenza, TDAP (she will try to provide office with her immunization record). Will request records from [facility] in [place] for her last PAP -  CMP14+EGFR - CBC  2. Encounter for HIV (human immunodeficiency virus) test - HIV Antibody (routine testing w rflx)  3. Encounter for surveillance of contraceptive pills LMP - 05/10/2022 - Norethindrone-Ethinyl Estradiol-Fe Biphas (LO LOESTRIN FE) 1 MG-10 MCG / 10 MCG tablet; Take 1 tablet by mouth daily.  Dispense: 84 tablet; Refill: 3  4. Encounter for hepatitis C screening test for low risk patient Will check Hepatitis C screening due to recent recommendations to screen all adults 18 years and older - Hepatitis C antibody  5. Screening for STD (sexually transmitted disease) - Hepatitis B surface antigen - RPR - HSV 1 and 2 Ab, IgG - Urine cytology ancillary only  6. Need for influenza vaccination Influenza vaccine administered Encouraged to take Tylenol as needed for fever or muscle aches. - Flu Vaccine QUAD 6+ mos PF IM (Fluarix Quad PF)  7. BMI 28.0-28.9,adult Goal to exercise 150 minutes per week with at least 2 days of strength training Encouraged to park further when at the store, take stairs instead of elevators and to walk in place during commercials. Increase water intake to at least one gallon of water daily.  8. Elevated cholesterol Comments: Will check cholesterol levels, were checked last in October by her previous provider I do not have the results at this time - Lipid panel  9. Encounter to establish care Last seen at [facility] in October, has relocated from [facility] area    Patient was given opportunity to ask  questions. Patient verbalized understanding of the plan and was able to repeat key elements of the plan. All questions were answered to their satisfaction.   Sophia Felts, FNP   I, Sophia Felts, FNP, have reviewed all documentation for this visit. The documentation on 05/13/22 for the exam, diagnosis, procedures, and orders are all accurate and complete.   THE PATIENT IS ENCOURAGED TO PRACTICE SOCIAL DISTANCING DUE TO THE COVID-19 PANDEMIC.

## 2022-05-13 NOTE — Patient Instructions (Signed)

## 2022-05-14 LAB — RPR: RPR Ser Ql: NONREACTIVE

## 2022-05-14 LAB — CMP14+EGFR
ALT: 56 IU/L — ABNORMAL HIGH (ref 0–32)
AST: 25 IU/L (ref 0–40)
Albumin/Globulin Ratio: 1.8 (ref 1.2–2.2)
Albumin: 5.2 g/dL — ABNORMAL HIGH (ref 4.0–5.0)
Alkaline Phosphatase: 47 IU/L (ref 44–121)
BUN/Creatinine Ratio: 22 (ref 9–23)
BUN: 13 mg/dL (ref 6–20)
Bilirubin Total: 0.4 mg/dL (ref 0.0–1.2)
CO2: 21 mmol/L (ref 20–29)
Calcium: 9.6 mg/dL (ref 8.7–10.2)
Chloride: 103 mmol/L (ref 96–106)
Creatinine, Ser: 0.6 mg/dL (ref 0.57–1.00)
Globulin, Total: 2.9 g/dL (ref 1.5–4.5)
Glucose: 80 mg/dL (ref 70–99)
Potassium: 4.3 mmol/L (ref 3.5–5.2)
Sodium: 142 mmol/L (ref 134–144)
Total Protein: 8.1 g/dL (ref 6.0–8.5)
eGFR: 129 mL/min/{1.73_m2} (ref >59)

## 2022-05-14 LAB — CBC
Hematocrit: 44.2 % (ref 34.0–46.6)
Hemoglobin: 14.1 g/dL (ref 11.1–15.9)
MCH: 28.2 pg (ref 26.6–33.0)
MCHC: 31.9 g/dL (ref 31.5–35.7)
MCV: 88 fL (ref 79–97)
Platelets: 258 10*3/uL (ref 150–450)
RBC: 5 x10E6/uL (ref 3.77–5.28)
RDW: 11.4 % — ABNORMAL LOW (ref 11.7–15.4)
WBC: 6.2 10*3/uL (ref 3.4–10.8)

## 2022-05-14 LAB — LIPID PANEL
Chol/HDL Ratio: 3.8 ratio (ref 0.0–4.4)
Cholesterol, Total: 210 mg/dL — ABNORMAL HIGH (ref 100–199)
HDL: 55 mg/dL (ref >39)
LDL Chol Calc (NIH): 142 mg/dL — ABNORMAL HIGH (ref 0–99)
Triglycerides: 70 mg/dL (ref 0–149)
VLDL Cholesterol Cal: 13 mg/dL (ref 5–40)

## 2022-05-14 LAB — HSV 1 AND 2 AB, IGG
HSV 1 Glycoprotein G Ab, IgG: <0.91 index (ref 0.00–0.90)
HSV 2 IgG, Type Spec: 5.41 index — ABNORMAL HIGH (ref 0.00–0.90)

## 2022-05-14 LAB — HEPATITIS B SURFACE ANTIGEN: Hepatitis B Surface Ag: NEGATIVE

## 2022-05-14 LAB — HIV ANTIBODY (ROUTINE TESTING W REFLEX): HIV Screen 4th Generation wRfx: NONREACTIVE

## 2022-05-14 LAB — HEPATITIS C ANTIBODY: Hep C Virus Ab: NONREACTIVE

## 2022-05-16 LAB — URINE CYTOLOGY ANCILLARY ONLY
Bacterial Vaginitis-Urine: NEGATIVE
Candida Urine: NEGATIVE
Chlamydia: NEGATIVE
Comment: NEGATIVE
Comment: NEGATIVE
Comment: NORMAL
Neisseria Gonorrhea: NEGATIVE
Trichomonas: NEGATIVE

## 2022-06-05 ENCOUNTER — Ambulatory Visit: Payer: Self-pay | Admitting: Nurse Practitioner

## 2022-07-17 ENCOUNTER — Ambulatory Visit: Payer: No Typology Code available for payment source | Admitting: Nurse Practitioner

## 2022-07-17 ENCOUNTER — Encounter: Payer: Self-pay | Admitting: Nurse Practitioner

## 2022-07-17 ENCOUNTER — Other Ambulatory Visit (HOSPITAL_COMMUNITY)
Admission: RE | Admit: 2022-07-17 | Discharge: 2022-07-17 | Disposition: A | Discharge status: Home / Self Care | Payer: No Typology Code available for payment source | Source: Ambulatory Visit | Attending: Nurse Practitioner | Admitting: Nurse Practitioner

## 2022-07-17 VITALS — BP 120/80 | Temp 98.9°F | Ht 69.0 in | Wt 195.6 lb

## 2022-07-17 DIAGNOSIS — B009 Herpesviral infection, unspecified: Secondary | ICD-10-CM | POA: Diagnosis not present

## 2022-07-17 DIAGNOSIS — Z124 Encounter for screening for malignant neoplasm of cervix: Secondary | ICD-10-CM | POA: Diagnosis not present

## 2022-07-17 MED ORDER — VALACYCLOVIR HCL 500 MG PO TABS: 500.0000 mg | ORAL_TABLET | Freq: Every day | ORAL | 1 refills | Status: AC

## 2022-07-17 NOTE — Patient Instructions (Signed)

## 2022-07-17 NOTE — Progress Notes (Signed)
Barnet Glasgow Martin,acting as a Education administrator for Minette Brine, FNP.,have documented all relevant documentation on the behalf of Minette Brine, FNP,as directed by  Minette Brine, FNP while in the presence of Minette Brine, [place].    Subjective:     Patient ID: Sophia King , female    DOB: 09/19/2000 , 22 y.o.   MRN: [id number]   Chief Complaint  Patient presents with   Hyperlipidemia    HPI  Patient presents for a pap.  Patient also didn't get her pap at physical due to menstrual cycle.     Past Medical History:  Diagnosis Date   Medical history non-contributory      History reviewed. No pertinent family history.   Current Outpatient Medications:    Norethindrone-Ethinyl Estradiol-Fe Biphas (LO LOESTRIN FE) 1 MG-10 MCG / 10 MCG tablet, Take 1 tablet by mouth daily., Disp: 84 tablet, Rfl: 3   valACYclovir (VALTREX) 500 MG tablet, Take 1 tablet (500 mg total) by mouth daily., Disp: 90 tablet, Rfl: 1   No Known Allergies   Review of Systems  Constitutional: Negative.   HENT: Negative.    Eyes: Negative.   Respiratory: Negative.    Cardiovascular: Negative.   Gastrointestinal: Negative.      Today's Vitals   07/17/22 1629  BP: 120/80  Temp: 98.9 F (37.2 C)  TempSrc: Oral  Weight: 195 lb 9.6 oz (88.7 kg)  Height: '5\' 9"'$  (1.753 m)  PainSc: 0-No pain   Body mass index is 28.89 kg/m.   Objective:  Physical Exam Vitals reviewed. Exam conducted with a chaperone present.  Constitutional:      General: She is not in acute distress.    Appearance: Normal appearance. She is well-developed. She is obese.  Pulmonary:     Effort: Pulmonary effort is normal. No respiratory distress.  Chest:     Chest wall: No tenderness.  Genitourinary:    General: Normal vulva.     Exam position: Lithotomy position.     Labia:        Right: No rash.        Left: No rash.      Vagina: Normal. No vaginal discharge.     Cervix: Normal.     Uterus: Normal.      Adnexa: Right adnexa normal  and left adnexa normal.     Rectum: Normal. Guaiac result negative.  Neurological:     General: No focal deficit present.     Mental Status: She is alert and oriented to person, place, and time.     Cranial Nerves: No cranial nerve deficit.  Psychiatric:        Mood and Affect: Mood normal.        Behavior: Behavior normal.        Thought Content: Thought content normal.        Judgment: Judgment normal.         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 No outbreaks wants for in case has one. May take lysine. - valACYclovir (VALTREX) 500 MG tablet; Take 1 tablet (500 mg total) by mouth daily.  Dispense: 90 tablet; Refill: 1  2. Pap smear for cervical cancer screening Comments: 1st PAP done without any difficulty. - Cytology -Pap Smear    Patient was given opportunity to ask questions. Patient verbalized  understanding of the plan and was able to repeat key elements of the plan. All questions were answered to their satisfaction.  Minette Brine, FNP   I, Minette Brine, FNP, have reviewed all documentation for this visit. The documentation on 07/17/22 for the exam, diagnosis, procedures, and orders are all accurate and complete.   IF YOU HAVE BEEN REFERRED TO A SPECIALIST, IT MAY TAKE 1-2 WEEKS TO SCHEDULE/PROCESS THE REFERRAL. IF YOU HAVE NOT HEARD FROM US/SPECIALIST IN TWO WEEKS, PLEASE GIVE [place] A CALL AT [phone].   THE PATIENT IS ENCOURAGED TO PRACTICE SOCIAL DISTANCING DUE TO THE COVID-19 PANDEMIC.

## 2022-07-18 DIAGNOSIS — Z124 Encounter for screening for malignant neoplasm of cervix: Secondary | ICD-10-CM | POA: Diagnosis present

## 2022-07-23 LAB — CYTOLOGY - PAP: Diagnosis: NEGATIVE

## 2022-08-18 ENCOUNTER — Telehealth: Payer: No Typology Code available for payment source | Admitting: Nurse Practitioner

## 2022-08-18 DIAGNOSIS — N3000 Acute cystitis without hematuria: Secondary | ICD-10-CM | POA: Diagnosis not present

## 2022-08-18 MED ORDER — NITROFURANTOIN MONOHYD MACRO 100 MG PO CAPS: 100.0000 mg | ORAL_CAPSULE | Freq: Two times a day (BID) | ORAL | 0 refills | Status: AC

## 2022-08-18 NOTE — Progress Notes (Signed)
Virtual Visit Consent   Sophia King, you are scheduled for a virtual visit with a [facility] provider today. Just as with appointments in the office, your consent must be obtained to participate. Your consent will be active for this visit and any virtual visit you may have with one of our providers in the next 365 days. If you have a MyChart account, a copy of this consent can be sent to you electronically.  As this is a virtual visit, video technology does not allow for your provider to perform a traditional examination. This may limit your provider's ability to fully assess your condition. If your provider identifies any concerns that need to be evaluated in person or the need to arrange testing (such as labs, EKG, etc.), we will make arrangements to do so. Although advances in technology are sophisticated, we cannot ensure that it will always work on either your end or our end. If the connection with a video visit is poor, the visit may have to be switched to a telephone visit. With either a video or telephone visit, we are not always able to ensure that we have a secure connection.  By engaging in this virtual visit, you consent to the provision of healthcare and authorize for your insurance to be billed (if applicable) for the services provided during this visit. Depending on your insurance coverage, you may receive a charge related to this service.  I need to obtain your verbal consent now. Are you willing to proceed with your visit today? Sophia King has provided verbal consent on 08/18/2022 for a virtual visit (video or telephone). Viviano Simas, FNP  Date: 08/18/2022 6:59 PM  Virtual Visit via Video Note   I, Viviano Simas, connected with  Sophia King  ([id number], 24-02-2001) on 08/18/22 at  7:00 PM EDT by a video-enabled telemedicine application and verified that I am speaking with the correct person using two identifiers.  Location: Patient: Virtual Visit Location Patient:  Home Provider: Virtual Visit Location Provider: Home Office   I discussed the limitations of evaluation and management by telemedicine and the availability of in person appointments. The patient expressed understanding and agreed to proceed.    History of Present Illness: Sophia King is a 22 y.o. 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 05/02/2019   Abnormal antenatal test 04/26/2019   Rh negative state in antepartum period 03/07/2019   Trichomoniasis of vagina 02/22/2019   Supervision of normal first pregnancy, antepartum 02/17/2019    Allergies: No Known Allergies Medications:  Current Outpatient Medications:    Norethindrone-Ethinyl Estradiol-Fe Biphas (LO LOESTRIN FE) 1 MG-10 MCG / 10 MCG tablet, Take 1 tablet by mouth daily., Disp: 84 tablet, Rfl: 3   valACYclovir (VALTREX) 500 MG tablet, Take 1 tablet (500 mg total) by mouth daily., Disp: 90 tablet, Rfl:  1  Observations/Objective: Patient is well-developed, well-nourished in no acute distress.  Resting comfortably  at home.  Head is normocephalic, atraumatic.  No labored breathing.  Speech is clear and coherent with logical content.  Patient is alert and oriented at baseline.    Assessment and Plan: 1. Acute cystitis without hematuria  - nitrofurantoin, macrocrystal-monohydrate, (MACROBID) 100 MG capsule; Take 1 capsule (100 mg total)  by mouth 2 (two) times daily for 5 days.  Dispense: 10 capsule; Refill: 0    Push fluids education sent via mychart   Follow Up Instructions: I discussed the assessment and treatment plan with the patient. The patient was provided an opportunity to ask questions and all were answered. The patient agreed with the plan and demonstrated an understanding of the instructions.  A copy of instructions were sent to the patient via MyChart unless otherwise noted below.    The patient was advised to call back or seek an in-person evaluation if the symptoms worsen or if the condition fails to improve as anticipated.  Time:  I spent 10 minutes with the patient via telehealth technology discussing the above problems/concerns.    Viviano Simas, FNP

## 2022-08-25 ENCOUNTER — Telehealth: Payer: No Typology Code available for payment source

## 2022-08-25 ENCOUNTER — Ambulatory Visit
Admission: RE | Admit: 2022-08-25 | Discharge: 2022-08-25 | Disposition: A | Discharge status: Home / Self Care | Payer: No Typology Code available for payment source | Source: Ambulatory Visit | Attending: Urgent Care | Admitting: Urgent Care

## 2022-08-25 ENCOUNTER — Ambulatory Visit: Admit: 2022-08-25 | Payer: No Typology Code available for payment source

## 2022-08-25 VITALS — BP 117/77 | HR 88 | Temp 98.3°F | Resp 16

## 2022-08-25 DIAGNOSIS — R350 Frequency of micturition: Secondary | ICD-10-CM | POA: Diagnosis present

## 2022-08-25 DIAGNOSIS — R300 Dysuria: Secondary | ICD-10-CM

## 2022-08-25 DIAGNOSIS — Z7251 High risk heterosexual behavior: Secondary | ICD-10-CM | POA: Diagnosis present

## 2022-08-25 LAB — POCT URINALYSIS DIP (MANUAL ENTRY)
Bilirubin, UA: NEGATIVE
Glucose, UA: NEGATIVE mg/dL
Ketones, POC UA: NEGATIVE mg/dL
Leukocytes, UA: NEGATIVE
Nitrite, UA: NEGATIVE
Protein Ur, POC: NEGATIVE mg/dL
Spec Grav, UA: >=1.03 — AB (ref 1.010–1.025)
Urobilinogen, UA: 0.2 E.U./dL
pH, UA: 6.5 (ref 5.0–8.0)

## 2022-08-25 LAB — POCT URINE PREGNANCY: Preg Test, Ur: NEGATIVE

## 2022-08-25 MED ORDER — PHENAZOPYRIDINE HCL 200 MG PO TABS: 200.0000 mg | ORAL_TABLET | Freq: Three times a day (TID) | ORAL | 0 refills | Status: DC | PRN

## 2022-08-25 NOTE — Discharge Instructions (Addendum)
Please start Pyridium to help with urinary pain. Make sure you hydrate very well with plain water and a quantity of 64 ounces of water a day.  Please limit drinks that are considered urinary irritants such as soda, sweet tea, coffee, energy drinks, alcohol.  These can worsen your urinary and genital symptoms but also be the source of them.  I will let you know about your vaginal swab and urine culture results through MyChart to see if we need to prescribe or change your antibiotics based off of those results.

## 2022-08-25 NOTE — ED Triage Notes (Signed)
Pt was treated for UTI/completed abx 2 days ago-c/o cont'd dysuria and abd cramping-NAD-steady gait

## 2022-08-25 NOTE — ED Provider Notes (Signed)
[facility]  Note:  This document was prepared using Conservation officer, historic buildings and may include unintentional dictation errors.  MRN: [id number] DOB: [birth year]  Subjective:   Sophia King is a 22 y.o. female presenting for 1 week history of persistent dysuria, urinary frequency, abdominal cramping. Had a video visit,was started on and finished Macrobid. Had some relief but not complete resolution. Denies fever, n/v, abdominal pain, rashes, hematuria, vaginal discharge. Patient is sexually active, has 1 female partner and does not use protection. Does not drink many fluids.   No current facility-administered medications for this encounter.  Current Outpatient Medications:    Norethindrone-Ethinyl Estradiol-Fe Biphas (LO LOESTRIN FE) 1 MG-10 MCG / 10 MCG tablet, Take 1 tablet by mouth daily., Disp: 84 tablet, Rfl: 3   valACYclovir (VALTREX) 500 MG tablet, Take 1 tablet (500 mg total) by mouth daily., Disp: 90 tablet, Rfl: 1   No Known Allergies  Past Medical History:  Diagnosis Date   Medical history non-contributory      Past Surgical History:  Procedure Laterality Date   NO PAST SURGERIES      No family history on file.  Social History   Tobacco Use   Smoking status: Never   Smokeless tobacco: Never  Vaping Use   Vaping Use: Never used  Substance Use Topics   Alcohol use: Yes    Comment: socially   Drug use: Not Currently    ROS   Objective:   Vitals: BP 117/77 (BP Location: Right Arm)   Pulse 88   Temp 98.3 F (36.8 C) (Oral)   Resp 16   LMP 08/01/2022   SpO2 97%   Physical Exam Constitutional:      General: She is not in acute distress.    Appearance: Normal appearance. She is well-developed. She is not ill-appearing, toxic-appearing or diaphoretic.  HENT:     Head: Normocephalic and atraumatic.     Nose: Nose normal.     Mouth/Throat:     Mouth: Mucous membranes are moist.     Pharynx: Oropharynx is clear.  Eyes:      General: No scleral icterus.       Right eye: No discharge.        Left eye: No discharge.     Extraocular Movements: Extraocular movements intact.     Conjunctiva/sclera: Conjunctivae normal.  Cardiovascular:     Rate and Rhythm: Normal rate.  Pulmonary:     Effort: Pulmonary effort is normal.  Abdominal:     General: Bowel sounds are normal. There is no distension.     Palpations: Abdomen is soft. There is no mass.     Tenderness: There is no abdominal tenderness. There is no right CVA tenderness, left CVA tenderness, guarding or rebound.  Skin:    General: Skin is warm and dry.  Neurological:     General: No focal deficit present.     Mental Status: She is alert and oriented to person, place, and time.  Psychiatric:        Mood and Affect: Mood normal.        Behavior: Behavior normal.        Thought Content: Thought content normal.        Judgment: Judgment normal.    Results for orders placed or performed during the hospital encounter of 08/25/22 (from the past 24 hour(s))  POCT urinalysis dipstick     Status: Abnormal   Collection Time: 08/25/22  5:06 PM  Result Value Ref Range   Color, UA yellow yellow   Clarity, UA clear clear   Glucose, UA negative negative mg/dL   Bilirubin, UA negative negative   Ketones, POC UA negative negative mg/dL   Spec Grav, UA >=1.610 (A) 1.010 - 1.025   Blood, UA trace-intact (A) negative   pH, UA 6.5 5.0 - 8.0   Protein Ur, POC negative negative mg/dL   Urobilinogen, UA 0.2 0.2 or 1.0 E.U./dL   Nitrite, UA Negative Negative   Leukocytes, UA Negative Negative  POCT urine pregnancy     Status: None   Collection Time: 08/25/22  5:06 PM  Result Value Ref Range   Preg Test, Ur Negative Negative   Assessment and Plan :   PDMP not reviewed this encounter.  1. Urinary frequency   2. Dysuria   3. Unprotected sex    Patient declined empiric treatment and I am in agreement.  Emphasized need to hydrate more consistently.  Use  Pyridium as needed for painful urination.  Vaginal swab and urine culture results pending.  Counseled patient on potential for adverse effects with medications prescribed/recommended today, ER and return-to-clinic precautions discussed, patient verbalized understanding.    Wallis Bamberg, [place] 08/25/22 1715

## 2022-08-26 LAB — CERVICOVAGINAL ANCILLARY ONLY
Bacterial Vaginitis (gardnerella): NEGATIVE
Candida Glabrata: NEGATIVE
Candida Vaginitis: NEGATIVE
Chlamydia: NEGATIVE
Comment: NEGATIVE
Comment: NEGATIVE
Comment: NEGATIVE
Comment: NEGATIVE
Comment: NEGATIVE
Comment: NORMAL
Neisseria Gonorrhea: NEGATIVE
Trichomonas: NEGATIVE

## 2022-08-29 ENCOUNTER — Encounter (HOSPITAL_COMMUNITY): Payer: Self-pay

## 2022-08-29 ENCOUNTER — Ambulatory Visit: Payer: No Typology Code available for payment source

## 2022-08-29 ENCOUNTER — Ambulatory Visit (HOSPITAL_COMMUNITY)
Admission: RE | Admit: 2022-08-29 | Discharge: 2022-08-29 | Disposition: A | Discharge status: Home / Self Care | Payer: No Typology Code available for payment source | Source: Ambulatory Visit | Attending: Nurse Practitioner | Admitting: Nurse Practitioner

## 2022-08-29 ENCOUNTER — Telehealth: Payer: No Typology Code available for payment source | Admitting: Emergency Medicine

## 2022-08-29 VITALS — BP 117/81 | HR 84 | Temp 98.4°F | Resp 18

## 2022-08-29 DIAGNOSIS — R1033 Periumbilical pain: Secondary | ICD-10-CM

## 2022-08-29 DIAGNOSIS — R300 Dysuria: Secondary | ICD-10-CM

## 2022-08-29 DIAGNOSIS — K5900 Constipation, unspecified: Secondary | ICD-10-CM | POA: Diagnosis not present

## 2022-08-29 NOTE — Discharge Instructions (Addendum)
Your abdominal pain is likely due to constipation.  You may purchase Colace and take this once daily over-the-counter.  This is a stool softener.  You may also purchase MiraLAX and take 1 dose of this daily for the next couple of days to promote complete bowel movements.  Continue to increase your water intake and your fiber intake by eating plenty of fruits and vegetables/whole grains.  You may also apply heat to the lower abdomen and the lower back as needed for further pain relief.  You may use Tylenol and ibuprofen as needed for pain relief as well.  If you develop any new or worsening symptoms or do not improve in the next 2 to 3 days, please return.  If your symptoms are severe, please go to the emergency room.  Follow-up with your primary care provider for further evaluation and management of your symptoms as well as ongoing wellness visits.  I hope you feel better!

## 2022-08-29 NOTE — ED Provider Notes (Signed)
[facility]    CSN: [id number] Arrival date & time: 08/29/22  1617      History   Chief Complaint Chief Complaint  Patient presents with   Sophia King    Entered by patient    HPI DENIESHA STENGLEIN is a 22 y.o. female.   Patient presents to urgent care for evaluation of right lower quadrant Sophia King that started 4 days ago. She was seen 2 weeks ago for UTI and finished antibiotics on Saturday August 22, 2021 (macrobid). She was then seen on Monday August 24, 2021 at urgent care for ongoing lower Sophia cramping, dysuria after urination, and vaginal irritation where she tested negative for STDs and urine was negative for residual signs of UTI. Pregnancy was negative at that visit as well. She was told that she was dehydrated at previous visit so she has been drinking plenty of water and states this has helped with King slightly. Currently experiencing localized King to the right upper quadrant abdomen near her belly button for the last 3 days. Also complains of bilateral lower back aches.  Heat makes the King better.  Any laughing or sneezing makes King worse.  Last normal bowel movement was this morning, however she does state that her bowel movements have been slightly smaller than normal.  While she was taking the antibiotic for UTI, she states she was very constipated.  Urinary symptoms have resolved. No N/V/D, cough, viral URI symptoms, fever/chills, rash, or changes in diet. Describes the Sophia/back King as "dull and constant". Takes OCPs and states she does not usually experience menstrual cramping. She has not missed any doses of OCPs. LMP was 08/01/2022.    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 Authorizing Provider  Norethindrone-Ethinyl Estradiol-Fe Biphas (LO LOESTRIN FE) 1 MG-10 MCG / 10 MCG tablet Take 1 tablet by mouth daily. 05/13/22  Yes [name], FNP  phenazopyridine (PYRIDIUM) 200 MG tablet Take 1 tablet (200 mg total) by mouth 3 (three) times daily as needed for King. 08/25/22   Wallis Bamberg, PA-C  valACYclovir (VALTREX) 500 MG tablet Take 1 tablet (500 mg total) by mouth daily. 07/17/22 01/13/23  [name], FNP    Family History History reviewed. No pertinent family history.  Social History Social History   Tobacco Use   Smoking status: Never   Smokeless tobacco: Never  Vaping Use  Vaping Use: Never used  Substance Use Topics   Alcohol use: Yes    Comment: socially   Drug use: Not Currently     Allergies   Patient has no known allergies.   Review of Systems Review of Systems  Gastrointestinal:  Positive for Sophia King.  Per HPI   Physical Exam Triage Vital Signs ED Triage Vitals  Enc Vitals Group     BP 08/29/22 1648 117/81     Pulse Rate 08/29/22 1648 84     Resp 08/29/22 1648 18     Temp 08/29/22 1648 98.4 F (36.9 C)     Temp src --      SpO2 08/29/22 1648 98 %     Weight --      Height --      Head Circumference --      Peak Flow --      King Score 08/29/22 1647 5     King Loc --      King Edu? --      Excl. in GC? --    No data found.  Updated Vital Signs BP 117/81 (BP Location: Left Arm)   Pulse  84   Temp 98.4 F (36.9 C)   Resp 18   LMP 08/01/2022   SpO2 98%   Visual Acuity Right Eye Distance:   Left Eye Distance:   Bilateral Distance:    Right Eye Near:   Left Eye Near:    Bilateral Near:     Physical Exam Vitals and nursing note reviewed.  Constitutional:      Appearance: She is not ill-appearing or toxic-appearing.  HENT:     Head: Normocephalic and atraumatic.     Right Ear: Hearing, tympanic membrane, ear canal and external ear normal.     Left Ear: Hearing, tympanic membrane, ear canal and external ear normal.     Nose: Nose normal.     Mouth/Throat:     Lips: Pink.     Mouth: Mucous membranes are moist. No injury.     Tongue: No lesions. Tongue does not deviate from midline.     Palate: No mass and lesions.     Pharynx: Oropharynx is clear. Uvula midline. No pharyngeal swelling, oropharyngeal exudate, posterior oropharyngeal erythema or uvula swelling.     Tonsils: No tonsillar exudate or tonsillar abscesses.  Eyes:     General: Lids are normal. Vision grossly intact. Gaze aligned appropriately.     Extraocular Movements: Extraocular movements intact.     Conjunctiva/sclera: Conjunctivae normal.  Cardiovascular:     Rate and Rhythm: Normal rate and regular rhythm.     Heart sounds: Normal heart sounds, S1 normal and S2 normal.  Pulmonary:     Effort: Pulmonary effort is normal. No respiratory distress.     Breath sounds: Normal breath sounds and air entry.  Sophia:     General: Abdomen is flat. Bowel sounds are normal. There is no distension.     Palpations: Abdomen is soft.     Tenderness: There is Sophia tenderness in the periumbilical area. There is no right CVA tenderness, left CVA tenderness, guarding or rebound. Negative signs include Murphy's sign and McBurney's sign.  Musculoskeletal:     Cervical back: Neck supple.  Skin:    General: Skin is warm and dry.     Capillary Refill: Capillary refill takes less than 2 seconds.     Findings:  No rash.  Neurological:     General: No focal  deficit present.     Mental Status: She is alert and oriented to person, place, and time. Mental status is at baseline.     Cranial Nerves: No dysarthria or facial asymmetry.  Psychiatric:        Mood and Affect: Mood normal.        Speech: Speech normal.        Behavior: Behavior normal.        Thought Content: Thought content normal.        Judgment: Judgment normal.      UC Treatments / Results  Labs (all labs ordered are listed, but only abnormal results are displayed) Labs Reviewed - No data to display  EKG   Radiology No results found.  Procedures Procedures (including critical care time)  Medications Ordered in UC Medications - No data to display  Initial Impression / Assessment and Plan / UC Course  I have reviewed the triage vital signs and the nursing notes.  Pertinent labs & imaging results that were available during my care of the patient were reviewed by me and considered in my medical decision making (see chart for details).   1.  Periumbilical Sophia King, constipation Presentation consistent with acute constipation.  Patient may purchase MiraLAX over-the-counter and take 1 dose per day for the next few days to promote consistent bowel movements, then use this as needed for constipation.  May use Colace stool softener daily over-the-counter.  Advised to increase fluid intake and fiber intake.  Discussed return precautions.  No peritoneal signs to Sophia exam.  She may also use heat and ibuprofen/Tylenol as needed for further King relief.  She is agreeable with plan.  Discussed physical exam and available lab work findings in clinic with patient.  Counseled patient regarding appropriate use of medications and potential side effects for all medications recommended or prescribed today. Discussed red flag signs and symptoms of worsening condition,when to call the PCP office, return to urgent care, and when to seek  higher level of care in the emergency department. Patient verbalizes understanding and agreement with plan. All questions answered. Patient discharged in stable condition.    Final Clinical Impressions(s) / UC Diagnoses   Final diagnoses:  Periumbilical Sophia King  Constipation, unspecified constipation type     Discharge Instructions      Your Sophia King is likely due to constipation.  You may purchase Colace and take this once daily over-the-counter.  This is a stool softener.  You may also purchase MiraLAX and take 1 dose of this daily for the next couple of days to promote complete bowel movements.  Continue to increase your water intake and your fiber intake by eating plenty of fruits and vegetables/whole grains.  You may also apply heat to the lower abdomen and the lower back as needed for further King relief.  You may use Tylenol and ibuprofen as needed for King relief as well.  If you develop any new or worsening symptoms or do not improve in the next 2 to 3 days, please return.  If your symptoms are severe, please go to the emergency room.  Follow-up with your primary care provider for further evaluation and management of your symptoms as well as ongoing wellness visits.  I hope you feel better!     ED Prescriptions   None    PDMP not reviewed this encounter.   Carlisle Beers, [place] 08/29/22 1744

## 2022-08-29 NOTE — ED Triage Notes (Signed)
Pt states she was recently treated for a UTI and finished antibiotics on Saturday. She was seen on 08/25/2022 for right sided abdominal pain and had labs done all cleared. She states she is still having the pain so she wants checked again.

## 2022-08-29 NOTE — Progress Notes (Signed)
Because of your persistent symptoms, I feel your condition warrants further evaluation and I recommend that you be seen in a face to face visit.  You may require additional lab tests to accurately diagnose your condition which cannot be performed or ordered through virtual visit.   NOTE: There will be NO CHARGE for this eVisit   If you are having a true medical emergency please call 911.      For an urgent face to face visit, [place] has eight urgent care centers for your convenience:   NEW!! [facility] Health [facility] Center at [facility] Get Driving Directions [phone] [address] [place], [address]    [facility]gent Care Center at [facility] Get Driving Directions [phone] [address] [place], [place] [address]   [facility] Health Urgent Care Center [facility]) Get Driving Directions [phone] [address] [place], [place] [address]  [facility] Health Urgent Care Center [facility]) Get Driving Directions [phone] [address] [place],  [place]  [address]  [facility] Health Urgent [facility] Center [facility] - at [employer]  [phone] [address] [place],  [place] [address]   [facility] Health Urgent Care at [facility] Get Driving Directions [phone] [address] [place], [place] [address]   [facility] Health Urgent Care at [facility] Get Driving Directions  [phone] [address] [place], [place] [address]   [facility] Health Urgent Care at [facility] Directions [phone] [address] [place], [place] [address]  Your MyChart E-visit questionnaire answers were reviewed by a board certified advanced clinical practitioner to complete your personal care plan based on your specific symptoms.  Thank you for using e-Visits.   Approximately 5 minutes was used in reviewing the patient's chart,  questionnaire, prescribing medications, and documentation.

## 2022-12-03 ENCOUNTER — Emergency Department (HOSPITAL_BASED_OUTPATIENT_CLINIC_OR_DEPARTMENT_OTHER)
Admission: EM | Admit: 2022-12-03 | Discharge: 2022-12-03 | Disposition: A | Discharge status: Home / Self Care | Payer: No Typology Code available for payment source | Attending: Emergency Medicine | Admitting: Emergency Medicine

## 2022-12-03 ENCOUNTER — Emergency Department (HOSPITAL_BASED_OUTPATIENT_CLINIC_OR_DEPARTMENT_OTHER): Payer: No Typology Code available for payment source | Admitting: Radiology

## 2022-12-03 ENCOUNTER — Ambulatory Visit: Payer: Self-pay

## 2022-12-03 ENCOUNTER — Encounter (HOSPITAL_BASED_OUTPATIENT_CLINIC_OR_DEPARTMENT_OTHER): Payer: Self-pay | Admitting: Emergency Medicine

## 2022-12-03 ENCOUNTER — Other Ambulatory Visit: Payer: Self-pay

## 2022-12-03 DIAGNOSIS — R002 Palpitations: Secondary | ICD-10-CM | POA: Diagnosis not present

## 2022-12-03 DIAGNOSIS — R0789 Other chest pain: Secondary | ICD-10-CM | POA: Diagnosis present

## 2022-12-03 DIAGNOSIS — R0602 Shortness of breath: Secondary | ICD-10-CM | POA: Insufficient documentation

## 2022-12-03 LAB — BASIC METABOLIC PANEL
Anion gap: 8 (ref 5–15)
BUN: 11 mg/dL (ref 6–20)
CO2: 28 mmol/L (ref 22–32)
Calcium: 9.5 mg/dL (ref 8.9–10.3)
Chloride: 104 mmol/L (ref 98–111)
Creatinine, Ser: 0.79 mg/dL (ref 0.44–1.00)
GFR, Estimated: >60 mL/min (ref >60)
Glucose, Bld: 104 mg/dL — ABNORMAL HIGH (ref 70–99)
Potassium: 3.8 mmol/L (ref 3.5–5.1)
Sodium: 140 mmol/L (ref 135–145)

## 2022-12-03 LAB — CBC
HCT: 40 % (ref 36.0–46.0)
Hemoglobin: 13.1 g/dL (ref 12.0–15.0)
MCH: 29 pg (ref 26.0–34.0)
MCHC: 32.8 g/dL (ref 30.0–36.0)
MCV: 88.5 fL (ref 80.0–100.0)
Platelets: 288 10*3/uL (ref 150–400)
RBC: 4.52 MIL/uL (ref 3.87–5.11)
RDW: 12.1 % (ref 11.5–15.5)
WBC: 8.6 10*3/uL (ref 4.0–10.5)
nRBC: 0 % (ref 0.0–0.2)

## 2022-12-03 LAB — PREGNANCY, URINE: Preg Test, Ur: NEGATIVE

## 2022-12-03 LAB — D-DIMER, QUANTITATIVE: D-Dimer, Quant: 0.36 ug/mL-FEU (ref 0.00–0.50)

## 2022-12-03 LAB — TSH: TSH: 1.682 u[IU]/mL (ref 0.350–4.500)

## 2022-12-03 LAB — TROPONIN I (HIGH SENSITIVITY): Troponin I (High Sensitivity): 2 ng/L (ref <18)

## 2022-12-03 MED ORDER — IBUPROFEN 600 MG PO TABS: 600.0000 mg | ORAL_TABLET | Freq: Four times a day (QID) | ORAL | 0 refills | Status: DC | PRN

## 2022-12-03 MED ORDER — KETOROLAC TROMETHAMINE 15 MG/ML IJ SOLN
15.0000 mg | Freq: Once | INTRAMUSCULAR | Status: AC
  Administered 2022-12-03: 15 mg via INTRAVENOUS
  Filled 2022-12-03: qty 1

## 2022-12-03 NOTE — Discharge Instructions (Signed)
As discussed, your workup today was overall reassuring.  Your heart enzyme was normal and your EKG appeared normal.  Chest x-ray did not show signs of pneumonia, collapsed lung or other abnormality.  The blood test for rule out of blood clot in the lung was also negative.  Follow MyChart for the results of your thyroid testing.  Will send anti-inflammatory medication to take as needed for symptoms of chest tightness.  Will recommend follow-up with primary care as well as cardiology in the outpatient setting for further evaluation/assessment of symptoms.  Please do not hesitate to return to emergency department if the worrisome signs and symptoms we discussed become apparent.

## 2022-12-03 NOTE — ED Provider Notes (Signed)
[facility] DE[facility]MENT AT [facility] Provider Note   CSN: [id number] Arrival date & time: 12/03/22  1456     History  Chief Complaint  Patient presents with   Chest Pain    IDALI SANNES is a 22 y.o. female.   Chest Pain   22 year old female presents emergency department with complaints of chest pain, palpitations, shortness of breath.  Patient states that she has been having symptoms mildly for the past year or so.  States that he saw her provider sometime last summer for feelings of heart racing, shortness of breath as well as some chest tightness.  States that symptoms have persisted for the past year or so therefore worsening on Monday.  Patient states that she had local anesthesia during a dental procedure where she had fillings removed as well as cavities "worked on."  She states that since then, her symptoms of heart racing, shortness of breath as well as chest tightness are worsened.  States that she tried to wait a couple days to see if symptoms were to go back to her baseline but they did not prompting visit to the emergency department.  Describes chest pain as tightness and central sternal located without radiation.  Denies any known exacerbating or relieving factors.  States she feels mildly short of breath with exertion.  Denies any fever, chills, cough, abdominal pain, nausea, vomiting.  Patient denies history of DVT/PE, known malignancy, known coagulopathy.  Patient was with recent procedure with local anesthesia on Monday and currently uses OCPs in the form of norethindrone ethinyl estradiol.  Past medical history significant for  Home Medications Prior to Admission medications   Medication Sig Start Date End Date Taking? Authorizing Provider  ibuprofen (ADVIL) 600 MG tablet Take 1 tablet (600 mg total) by mouth every 6 (six) hours as needed. 12/03/22  Yes Sherian Maroon A, PA  Norethindrone-Ethinyl Estradiol-Fe Biphas (LO LOESTRIN FE) 1 MG-10 MCG / 10  MCG tablet Take 1 tablet by mouth daily. 05/13/22   [name], FNP  phenazopyridine (PYRIDIUM) 200 MG tablet Take 1 tablet (200 mg total) by mouth 3 (three) times daily as needed for pain. 08/25/22   Wallis Bamberg, PA-C  valACYclovir (VALTREX) 500 MG tablet Take 1 tablet (500 mg total) by mouth daily. 07/17/22 01/13/23  [name], FNP      Allergies    Patient has no known allergies.    Review of Systems   Review of Systems  Cardiovascular:  Positive for chest pain.  All other systems reviewed and are negative.   Physical Exam Updated Vital Signs BP 117/77   Pulse 91   Temp 98.3 F (36.8 C)   Resp (!) 27   LMP 11/21/2022   SpO2 100%  Physical Exam Vitals and nursing note reviewed.  Constitutional:      General: She is not in acute distress.    Appearance: She is well-developed.  HENT:     Head: Normocephalic and atraumatic.  Eyes:     Conjunctiva/sclera: Conjunctivae normal.  Cardiovascular:     Rate and Rhythm: Normal rate and regular rhythm.     Heart sounds: No murmur heard. Pulmonary:     Effort: Pulmonary effort is normal. No respiratory distress.     Breath sounds: Normal breath sounds. No wheezing, rhonchi or rales.     Comments: Some anterior sternal chest tenderness to palpation. Chest:     Chest wall: Tenderness present.  Abdominal:     Palpations: Abdomen is soft.  Tenderness: There is no abdominal tenderness. There is no guarding.  Musculoskeletal:        General: No swelling.     Cervical back: Neck supple.     Right lower leg: No edema.     Left lower leg: No edema.  Skin:    General: Skin is warm and dry.     Capillary Refill: Capillary refill takes less than 2 seconds.  Neurological:     Mental Status: She is alert.  Psychiatric:        Mood and Affect: Mood normal.     ED Results / Procedures / Treatments   Labs (all labs ordered are listed, but only abnormal results are displayed) Labs Reviewed  BASIC METABOLIC PANEL - Abnormal;  Notable for the following components:      Result Value   Glucose, Bld 104 (*)    All other components within normal limits  CBC  PREGNANCY, URINE  D-DIMER, QUANTITATIVE  TSH  TROPONIN I (HIGH SENSITIVITY)    EKG None  Radiology DG Chest 2 View  Result Date: 12/03/2022 CLINICAL DATA:  Chest pain EXAM: CHEST - 2 VIEW COMPARISON:  None Available. FINDINGS: The heart size and mediastinal contours are within normal limits. Both lungs are clear. No consolidation, pneumothorax or effusion. No edema. The visualized skeletal structures are unremarkable. IMPRESSION: No acute cardiopulmonary disease Electronically Signed   By: Karen Kays M.D.   On: 12/03/2022 16:05    Procedures Procedures    Medications Ordered in ED Medications  ketorolac (TORADOL) 15 MG/ML injection 15 mg (15 mg Intravenous Given 12/03/22 1549)    ED Course/ Medical Decision Making/ A&P                                 Medical Decision Making Amount and/or Complexity of Data Reviewed Labs: ordered. Radiology: ordered.  Risk Prescription drug management.   This patient presents to the ED for concern of chest pain, palpitations, this involves an extensive number of treatment options, and is a complaint that carries with it a high risk of complications and morbidity.  The differential diagnosis includes ACS, PE, pneumothorax, musculoskeletal, aortic dissection, aortic aneurysm, anxiety, GERD, arrhythmia, thyroid history   Co morbidities that complicate the patient evaluation  See HPI   Additional history obtained:  Additional history obtained from EMR External records from outside source obtained and reviewed including hospital records   Lab Tests:  I Ordered, and personally interpreted labs.  The pertinent results include:  No leukocytosis.  No evidence of anemia.  Platelets within range.  Nausea abnormalities.  No renal dysfunction.  Urine pregnancy negative.  D-dimer within normal limits.  Troponin  of 2.  TSH of 1.62   Imaging Studies ordered:  I ordered imaging studies including chest x-ray I independently visualized and interpreted imaging which showed no acute cardiopulmonary abnormalities I agree with the radiologist interpretation   Cardiac Monitoring: / EKG:  The patient was maintained on a cardiac monitor.  I personally viewed and interpreted the cardiac monitored which showed an underlying rhythm of: Sinus rhythm   Consultations Obtained:  N/a   Problem List / ED Course / Critical interventions / Medication management  Chest tightness, palpitations I ordered medication including Toradol   Reevaluation of the patient after these medicines showed that the patient improved I have reviewed the patients home medicines and have made adjustments as needed   Social Determinants of Health:  Denies tobacco, illicit drug use   Test / Admission - Considered:  Chest tightness, palpitations Vitals signs within normal range and stable throughout visit. Laboratory/imaging studies significant for: See above 22 year old female presents emergency department with complaints of chest tightness, palpitations and some mild short of breath.  Patient's workup today overall reassuring.  Regarding chest pain, some reproducible anterior chest wall pain on exam.  Chest pain nonpleuritic.  Patient with negative troponin, lack of acute ischemic change on EKG so doubt ACS.  Second troponin was not pursued given duration of patient's symptoms over the past 3 days.  Given patient's initial tachycardia, complaints of chest pain and shortness of breath along with recent procedure and OCP use, and D-dimer was ordered of which was negative.  Low suspicion for PE.  Chest x-ray was without signs of pneumonia, pneumothorax or other cardiopulmonary abnormality.  Patient without evidence of volume overload, pulmonary vascular congestion/pleural effusions on chest x-ray imaging; low suspicion for CHF.  I  suspect patient's chest pain likely secondary to musculoskeletal etiology.  Regarding shortness of breath, no obvious tachypnea, hypoxia, wheeze/rales/rhonchi auscultated in lung fields bilaterally.  No evidence of anemia.  Unsure of exact etiology of patient's shortness of breath.  Regarding palpitations, patient without evidence of arrhythmia on EKG or on cardiac monitoring throughout duration of visit.  Patient was initially tachycardic mildly with a heart rate of 102 of which seem to resolve with time elapsed on the emergency department. Patient may benefit from cardiac monitoring if patient's palpitations are related to underlying arrhythmia.  Patient reassured by workup.  Will recommend close follow-up with primary care/cardiology in outpatient setting for reevaluation of patient's symptoms.  Treatment plan discussed at length with patient and she acknowledged understanding was agreeable to said plan.  Patient overall well-appearing, afebrile in no acute distress. Worrisome signs and symptoms were discussed with the patient, and the patient acknowledged understanding to return to the ED if noticed. Patient was stable upon discharge.          Final Clinical Impression(s) / ED Diagnoses Final diagnoses:  Feeling of chest tightness  Palpitations    Rx / DC Orders ED Discharge Orders          Ordered    ibuprofen (ADVIL) 600 MG tablet  Every 6 hours PRN        12/03/22 1641              Peter Garter, [place] 12/03/22 1836    Rondel Baton, MD 12/04/22 1122

## 2022-12-03 NOTE — Telephone Encounter (Signed)
  Chief Complaint: mid chest pain Symptoms: radiation to back, feels like racing heart rate Frequency: Monday   Disposition: [x] ED /[] Urgent Care (no appt availability in office) / [] Appointment(In office/virtual)/ []  [facility]/ [] Home Care/ [] Refused Recommended Disposition /[] [facility]/ []  Follow-up with PCP Additional Notes: advised NPO Reason for Disposition  Pain also in shoulder(s) or arm(s) or jaw  (Exception: Pain is clearly made worse by movement.)    Pain to back  Answer Assessment - Initial Assessment Questions 1. LOCATION: "Where does it hurt?"       Mid chest  2. RADIATION: "Does the pain go anywhere else?" (e.g., into neck, jaw, arms, back)     Back  3. ONSET: "When did the chest pain begin?" (Minutes, hours or days)      Monday at dentist for root canal had anesthesia Heart race dizziness near syncopal episode 4. PATTERN: "Does the pain come and go, or has it been constant since it started?"  "Does it get worse with exertion?"      Come and go racing heart   6. SEVERITY: "How bad is the pain?"  (e.g., Scale 1-10; mild, moderate, or severe)    - MILD (1-3): doesn't interfere with normal activities     - MODERATE (4-7): interferes with normal activities or awakens from sleep    - SEVERE (8-10): excruciating pain, unable to do any normal activities       Moderate   10. OTHER SYMPTOMS: "Do you have any other symptoms?" (e.g., dizziness, nausea, vomiting, sweating, fever, difficulty breathing, cough)       Feels like heart is racing  Protocols used: Chest Pain-A-AH

## 2022-12-03 NOTE — ED Triage Notes (Signed)
Chest  pain/ palpitations started Monday after dental surgery with anesthesia. Some sob reported.

## 2022-12-09 ENCOUNTER — Telehealth: Payer: Self-pay

## 2022-12-09 NOTE — Transitions of Care (Post Inpatient/ED Visit) (Signed)
   12/09/2022  Name: Sophia King MRN: [id number] DOB: [birth year]  Today's TOC FU Call Status: Today's TOC FU Call Status:: Successful TOC FU Call Completed TOC FU Call Complete Date: 12/09/22  Transition Care Management Follow-up Telephone Call Date of Discharge: 12/03/22 Discharge Facility: [facility] ([facility]) Type of Discharge: Inpatient Admission Primary Inpatient Discharge Diagnosis:: Feeling of chest tightness How have you been since you were released from the hospital?: Better Any questions or concerns?: No  Items Reviewed: Did you receive and understand the discharge instructions provided?: Yes Medications obtained,verified, and reconciled?: Yes (Medications Reviewed) Any new allergies since your discharge?: No Dietary orders reviewed?: No Do you have support at home?: Yes People in Home: parent(s)  Medications Reviewed Today: Medications Reviewed Today     Reviewed by Marlyn Corporal, CMA (Certified Medical Assistant) on 12/09/22 at 1611  Med List Status: <None>   Medication Order Taking? Sig Documenting Provider Last Dose Status Informant  ibuprofen (ADVIL) 600 MG tablet [id number] Yes Take 1 tablet (600 mg total) by mouth every 6 (six) hours as needed. Peter Garter, PA Taking Active   Norethindrone-Ethinyl Estradiol-Fe Biphas (LO LOESTRIN FE) 1 MG-10 MCG / 10 MCG tablet [id number] Yes Take 1 tablet by mouth daily. [name], FNP Taking Active   phenazopyridine (PYRIDIUM) 200 MG tablet [id number] Yes Take 1 tablet (200 mg total) by mouth 3 (three) times daily as needed for pain. Wallis Bamberg, PA-C Taking Active   valACYclovir (VALTREX) 500 MG tablet [id number] Yes Take 1 tablet (500 mg total) by mouth daily. [name], FNP Taking Active             Home Care and Equipment/Supplies: Were Home Health Services Ordered?: No Any new equipment or medical supplies ordered?: No  Functional Questionnaire: Do you need assistance with bathing/showering or  dressing?: No Do you need assistance with meal preparation?: No Do you need assistance with eating?: No Do you have difficulty maintaining continence: No Do you need assistance with getting out of bed/getting out of a chair/moving?: No Do you have difficulty managing or taking your medications?: No  Follow up appointments reviewed: PCP Follow-up appointment confirmed?: No MD Provider Line Number:[phone] Given: Yes Specialist Hospital Follow-up appointment confirmed?: Yes Date of Specialist follow-up appointment?: 02/10/23 Follow-Up Specialty Provider:: cardiologist Do you need transportation to your follow-up appointment?: No Do you understand care options if your condition(s) worsen?: Yes-patient verbalized understanding    SIGNATURE Lisabeth Devoid, CMA

## 2023-02-05 ENCOUNTER — Ambulatory Visit: Payer: No Typology Code available for payment source | Admitting: Internal Medicine

## 2023-02-17 NOTE — Progress Notes (Signed)
  Cardiology Office Note:   Date:  02/17/2023  ID:  Sophia King, DOB 04/07/2001, MRN [id number] PCP:  [name], FNP  [facility] Providers Cardiologist:  Alverda Skeans, MD Referring MD: [name], FNP  Chief Complaint/Reason for Referral: Chest pain, palpitation  PATIENT DID NOT APPEAR FOR APPOINTMENT   ASSESSMENT:    1. Precordial pain   2. Palpitations     PLAN:   In order of problems listed above:  PATIENT DID NOT APPEAR FOR APPOINTMENT   Chest pain:   Palpitations:        History of Present Illness:      FOCUSED PROBLEM LIST:   Hyperlipidemia BMI 03 March 2023: The patient is a 22 year old with a pelvis medical problems referred for evaluation of chest pain.  The patient was seen in the emergency department over the summer due to chest discomfort.  This occurred in July.  She stated that she been having the symptoms for about a year or so.  She has noted heart racing and shortness of breath along with the chest pain.  Laboratories including troponin and D-dimer were negative.  The patient was ultimately discharged home          Current Medications: No outpatient medications have been marked as taking for the 02/20/23 encounter (Office Visit) with Orbie Pyo, MD.     Review of Systems:   Please see the history of present illness.    All other systems reviewed and are negative.     EKGs/Labs/Other Test Reviewed:   EKG: EKG performed July 2024 that I personally reviewed demonstrates sinus tachycardia  EKG Interpretation Date/Time:    Ventricular Rate:    PR Interval:    QRS Duration:    QT Interval:    QTC Calculation:   R Axis:      Text Interpretation:           Risk Assessment/Calculations:    Physical Exam:     Signed, Orbie Pyo, MD  02/17/2023 6:11 PM    [facility] [address] [place], [place], [place]  [address] Phone: [phone]; Fax: [phone]   Note:  This document was  prepared using Dragon voice recognition software and may include unintentional dictation errors.

## 2023-02-20 ENCOUNTER — Ambulatory Visit: Payer: No Typology Code available for payment source | Admitting: Internal Medicine

## 2023-02-20 DIAGNOSIS — R072 Precordial pain: Secondary | ICD-10-CM

## 2023-02-20 DIAGNOSIS — R002 Palpitations: Secondary | ICD-10-CM

## 2023-03-20 ENCOUNTER — Encounter: Payer: Self-pay | Admitting: Nurse Practitioner

## 2023-03-23 ENCOUNTER — Other Ambulatory Visit: Payer: Self-pay

## 2023-03-23 DIAGNOSIS — Z3041 Encounter for surveillance of contraceptive pills: Secondary | ICD-10-CM

## 2023-03-23 MED ORDER — LO LOESTRIN FE 1 MG-10 MCG / 10 MCG PO TABS
1.0000 | ORAL_TABLET | Freq: Every day | ORAL | 3 refills | Status: DC
Start: 2023-03-23 — End: 2023-06-22

## 2023-03-30 NOTE — Progress Notes (Signed)
This encounter was created in error - please disregard.

## 2023-05-18 ENCOUNTER — Encounter: Payer: Self-pay | Admitting: Nurse Practitioner

## 2023-05-18 ENCOUNTER — Ambulatory Visit (INDEPENDENT_AMBULATORY_CARE_PROVIDER_SITE_OTHER): Payer: Managed Care, Other (non HMO) | Admitting: Nurse Practitioner

## 2023-05-18 VITALS — BP 100/64 | HR 90 | Temp 98.5°F | Ht 69.0 in | Wt 207.0 lb

## 2023-05-18 DIAGNOSIS — Z113 Encounter for screening for infections with a predominantly sexual mode of transmission: Secondary | ICD-10-CM

## 2023-05-18 DIAGNOSIS — Z23 Encounter for immunization: Secondary | ICD-10-CM

## 2023-05-18 DIAGNOSIS — E6609 Other obesity due to excess calories: Secondary | ICD-10-CM

## 2023-05-18 DIAGNOSIS — Z6830 Body mass index (BMI) 30.0-30.9, adult: Secondary | ICD-10-CM

## 2023-05-18 DIAGNOSIS — Z0000 Encounter for general adult medical examination without abnormal findings: Secondary | ICD-10-CM | POA: Diagnosis not present

## 2023-05-18 DIAGNOSIS — E782 Mixed hyperlipidemia: Secondary | ICD-10-CM

## 2023-05-18 DIAGNOSIS — E66811 Obesity, class 1: Secondary | ICD-10-CM | POA: Diagnosis not present

## 2023-05-18 DIAGNOSIS — Z79899 Other long term (current) drug therapy: Secondary | ICD-10-CM

## 2023-05-18 NOTE — Progress Notes (Signed)
 ROSCOE Kristeen JINNY Gladis, CMA,acting as a scribe for Gaines Ada, FNP.,have documented all relevant documentation on the behalf of Gaines Ada, FNP,as directed by  Gaines Ada, FNP while in the presence of Gaines Ada, FNP.  Subjective:    Patient ID: Sophia King , female    DOB: 2000-11-28 , 23 y.o.   MRN: [id number]  Chief Complaint  Patient presents with   Annual Exam    HPI  Patient presents today for HM, Patient reports compliance with medication. Patient denies any chest pain, SOB, or headaches. Patient has no concerns today.      Past Medical History:  Diagnosis Date   Medical history non-contributory      Family History  Problem Relation Age of Onset   Hypertension Maternal Grandfather    Cancer Maternal Grandmother    Diabetes Maternal Grandmother    Stroke Maternal Grandmother      Current Outpatient Medications:    Norethindrone-Ethinyl Estradiol-Fe Biphas (LO LOESTRIN FE ) 1 MG-10 MCG / 10 MCG tablet, Take 1 tablet by mouth daily., Disp: 84 tablet, Rfl: 3   metroNIDAZOLE  (FLAGYL ) 500 MG tablet, Take 1 tablet (500 mg total) by mouth 3 (three) times daily for 10 days., Disp: 30 tablet, Rfl: 0   No Known Allergies    The patient states she uses OCP (estrogen/progesterone) for birth control. Patient's last menstrual period was 05/08/2023.. Negative for Dysmenorrhea and Negative for Menorrhagia. Negative for: breast discharge, breast lump(s), breast pain and breast self exam. Associated symptoms include abnormal vaginal bleeding. Pertinent negatives include abnormal bleeding (hematology), anxiety, decreased libido, depression, difficulty falling sleep, dyspareunia, history of infertility, nocturia, sexual dysfunction, sleep disturbances, urinary incontinence, urinary urgency, vaginal discharge and vaginal itching. Diet regular; admits it could be better and is trying to cook more. The patient states her exercise level is moderate exercising 3 times a week since the  beginning of the year. She is walking and doing videos.    The patient's tobacco use is:  Social History   Tobacco Use  Smoking Status Never  Smokeless Tobacco Never  . She has been exposed to passive smoke. The patient's alcohol use is:  Social History   Substance and Sexual Activity  Alcohol Use Yes   Comment: socially  . Additional information: Last pap 07/17/2022, next one scheduled for 2027.    Review of Systems  Constitutional: Negative.   HENT: Negative.    Eyes: Negative.   Respiratory: Negative.    Cardiovascular: Negative.   Gastrointestinal: Negative.   Endocrine: Negative.   Genitourinary: Negative.   Musculoskeletal: Negative.   Skin: Negative.   Allergic/Immunologic: Negative.   Neurological: Negative.   Hematological: Negative.   Psychiatric/Behavioral: Negative.       Today's Vitals   05/18/23 1030  BP: 100/64  Pulse: 90  Temp: 98.5 F (36.9 C)  TempSrc: Oral  Weight: 207 lb (93.9 kg)  Height: 5' 9 (1.753 m)  PainSc: 0-No pain   Body mass index is 30.57 kg/m.  Wt Readings from Last 3 Encounters:  05/18/23 207 lb (93.9 kg)  07/17/22 195 lb 9.6 oz (88.7 kg)  05/13/22 194 lb 6.4 oz (88.2 kg)     Objective:  Physical Exam Vitals reviewed.  Constitutional:      General: She is not in acute distress.    Appearance: Normal appearance. She is well-developed. She is obese.  HENT:     Head: Normocephalic and atraumatic.     Right Ear: Hearing, tympanic membrane, ear canal and external  ear normal. There is no impacted cerumen.     Left Ear: Hearing, tympanic membrane, ear canal and external ear normal. There is no impacted cerumen.     Nose: Nose normal.     Mouth/Throat:     Mouth: Mucous membranes are moist.  Eyes:     General: Lids are normal.     Extraocular Movements: Extraocular movements intact.     Conjunctiva/sclera: Conjunctivae normal.     Pupils: Pupils are equal, round, and reactive to light.     Funduscopic exam:    Right eye:  No papilledema.        Left eye: No papilledema.  Neck:     Thyroid : No thyroid  mass.     Vascular: No carotid bruit.  Cardiovascular:     Rate and Rhythm: Normal rate and regular rhythm.     Pulses: Normal pulses.     Heart sounds: Normal heart sounds. No murmur heard. Pulmonary:     Effort: Pulmonary effort is normal. No respiratory distress.     Breath sounds: Normal breath sounds. No wheezing.  Chest:     Chest wall: No mass.  Breasts:    Tanner Score is 5.     Right: Normal. No mass or tenderness.     Left: Normal. No mass or tenderness.  Abdominal:     General: Abdomen is flat. Bowel sounds are normal. There is no distension.     Palpations: Abdomen is soft.     Tenderness: There is no abdominal tenderness.  Genitourinary:    Comments: Deferred  Musculoskeletal:        General: No swelling. Normal range of motion.     Cervical back: Full passive range of motion without pain, normal range of motion and neck supple.     Right lower leg: No edema.     Left lower leg: No edema.  Lymphadenopathy:     Upper Body:     Right upper body: No supraclavicular, axillary or pectoral adenopathy.     Left upper body: No supraclavicular, axillary or pectoral adenopathy.  Skin:    General: Skin is warm and dry.     Capillary Refill: Capillary refill takes less than 2 seconds.  Neurological:     General: No focal deficit present.     Mental Status: She is alert and oriented to person, place, and time.     Cranial Nerves: No cranial nerve deficit.     Sensory: No sensory deficit.     Motor: No weakness.  Psychiatric:        Mood and Affect: Mood normal.        Behavior: Behavior normal.        Thought Content: Thought content normal.        Judgment: Judgment normal.         Assessment And Plan:     Encounter for annual health examination Assessment & Plan: Behavior modifications discussed and diet history reviewed.   Pt will continue to exercise regularly and modify diet  with low GI, plant based foods and decrease intake of processed foods.  Recommend intake of daily multivitamin, Vitamin D, and calcium .  Recommend monthly self breast exams for preventive screenings, as well as recommend immunizations that include influenza, TDAP (done today)    Need for influenza vaccination Assessment & Plan: Influenza vaccine administered Encouraged to take Tylenol  as needed for fever or muscle aches.   Orders: -     Flu vaccine trivalent PF, 6mos  and older(Flulaval,Afluria,Fluarix,Fluzone)  COVID-19 vaccine administered Assessment & Plan: Covid 19 vaccine given in office observed for 15 minutes without any adverse reaction   Orders: -     Pfizer Comirnaty Covid-19 Vaccine 44yrs & older  Mixed hyperlipidemia -     CMP14+EGFR -     Lipid panel  Screening for STDs (sexually transmitted diseases) -     NuSwab Vaginitis Plus (VG+) -     RPR -     HIV Antibody (routine testing w rflx)  Class 1 obesity due to excess calories with body mass index (BMI) of 30.0 to 30.9 in adult, unspecified whether serious comorbidity present Assessment & Plan: She is encouraged to strive for BMI less than 30 to decrease cardiac risk. Advised to aim for at least 150 minutes of exercise per week. Will check HgbA1c due to weight   Orders: -     Hemoglobin A1c -     HSV 1 antibody, IgG  Other long term (current) drug therapy -     CBC with Differential/Platelet  Other orders -     Tdap vaccine greater than or equal to 7yo IM     No follow-ups on file. Patient was given opportunity to ask questions. Patient verbalized understanding of the plan and was able to repeat key elements of the plan. All questions were answered to their satisfaction.   Gaines Ada, FNP  I, Gaines Ada, FNP, have reviewed all documentation for this visit. The documentation on 05/18/23 for the exam, diagnosis, procedures, and orders are all accurate and complete.

## 2023-05-19 LAB — CMP14+EGFR
ALT: 17 [IU]/L (ref 0–32)
AST: 15 [IU]/L (ref 0–40)
Albumin: 4.6 g/dL (ref 4.0–5.0)
Alkaline Phosphatase: 44 [IU]/L (ref 44–121)
BUN/Creatinine Ratio: 13 (ref 9–23)
BUN: 9 mg/dL (ref 6–20)
Bilirubin Total: 0.4 mg/dL (ref 0.0–1.2)
CO2: 24 mmol/L (ref 20–29)
Calcium: 9.4 mg/dL (ref 8.7–10.2)
Chloride: 104 mmol/L (ref 96–106)
Creatinine, Ser: 0.7 mg/dL (ref 0.57–1.00)
Globulin, Total: 2.8 g/dL (ref 1.5–4.5)
Glucose: 86 mg/dL (ref 70–99)
Potassium: 4.5 mmol/L (ref 3.5–5.2)
Sodium: 142 mmol/L (ref 134–144)
Total Protein: 7.4 g/dL (ref 6.0–8.5)
eGFR: 124 mL/min/{1.73_m2} (ref >59)

## 2023-05-19 LAB — LIPID PANEL
Chol/HDL Ratio: 3.5 {ratio} (ref 0.0–4.4)
Cholesterol, Total: 205 mg/dL — ABNORMAL HIGH (ref 100–199)
HDL: 59 mg/dL (ref >39)
LDL Chol Calc (NIH): 136 mg/dL — ABNORMAL HIGH (ref 0–99)
Triglycerides: 56 mg/dL (ref 0–149)
VLDL Cholesterol Cal: 10 mg/dL (ref 5–40)

## 2023-05-19 LAB — CBC WITH DIFFERENTIAL/PLATELET
Basophils Absolute: 0 10*3/uL (ref 0.0–0.2)
Basos: 0 %
EOS (ABSOLUTE): 0.1 10*3/uL (ref 0.0–0.4)
Eos: 1 %
Hematocrit: 39.6 % (ref 34.0–46.6)
Hemoglobin: 12.8 g/dL (ref 11.1–15.9)
Immature Grans (Abs): 0 10*3/uL (ref 0.0–0.1)
Immature Granulocytes: 0 %
Lymphocytes Absolute: 2.2 10*3/uL (ref 0.7–3.1)
Lymphs: 25 %
MCH: 28.9 pg (ref 26.6–33.0)
MCHC: 32.3 g/dL (ref 31.5–35.7)
MCV: 89 fL (ref 79–97)
Monocytes Absolute: 0.7 10*3/uL (ref 0.1–0.9)
Monocytes: 8 %
Neutrophils Absolute: 6 10*3/uL (ref 1.4–7.0)
Neutrophils: 66 %
Platelets: 309 10*3/uL (ref 150–450)
RBC: 4.43 x10E6/uL (ref 3.77–5.28)
RDW: 11.8 % (ref 11.7–15.4)
WBC: 9.1 10*3/uL (ref 3.4–10.8)

## 2023-05-19 LAB — HIV ANTIBODY (ROUTINE TESTING W REFLEX): HIV Screen 4th Generation wRfx: NONREACTIVE

## 2023-05-19 LAB — HSV 1 ANTIBODY, IGG: HSV 1 Glycoprotein G Ab, IgG: NONREACTIVE

## 2023-05-19 LAB — RPR: RPR Ser Ql: NONREACTIVE

## 2023-05-19 LAB — HEMOGLOBIN A1C

## 2023-05-20 LAB — NUSWAB VAGINITIS PLUS (VG+)
Atopobium vaginae: HIGH {score} — AB
BVAB 2: HIGH {score} — AB
Candida albicans, NAA: NEGATIVE
Candida glabrata, NAA: NEGATIVE
Chlamydia trachomatis, NAA: NEGATIVE
Megasphaera 1: HIGH {score} — AB
Neisseria gonorrhoeae, NAA: NEGATIVE
Trich vag by NAA: NEGATIVE

## 2023-05-21 ENCOUNTER — Other Ambulatory Visit: Payer: Self-pay | Admitting: Nurse Practitioner

## 2023-05-21 MED ORDER — METRONIDAZOLE 500 MG PO TABS: 500.0000 mg | ORAL_TABLET | Freq: Three times a day (TID) | ORAL | 0 refills | Status: AC

## 2023-05-27 DIAGNOSIS — E782 Mixed hyperlipidemia: Secondary | ICD-10-CM | POA: Insufficient documentation

## 2023-05-27 DIAGNOSIS — E66811 Obesity, class 1: Secondary | ICD-10-CM | POA: Insufficient documentation

## 2023-05-27 DIAGNOSIS — Z0000 Encounter for general adult medical examination without abnormal findings: Secondary | ICD-10-CM | POA: Insufficient documentation

## 2023-05-27 DIAGNOSIS — Z23 Encounter for immunization: Secondary | ICD-10-CM | POA: Insufficient documentation

## 2023-05-27 NOTE — Assessment & Plan Note (Addendum)
Behavior modifications discussed and diet history reviewed.   Pt will continue to exercise regularly and modify diet with low GI, plant based foods and decrease intake of processed foods.  Recommend intake of daily multivitamin, Vitamin D, and calcium.  Recommend monthly self breast exams for preventive screenings, as well as recommend immunizations that include influenza, covid (done today) and tdap to be done at later date

## 2023-05-27 NOTE — Assessment & Plan Note (Signed)
Covid 19 vaccine given in office observed for 15 minutes without any adverse reaction  

## 2023-05-27 NOTE — Assessment & Plan Note (Signed)
She is encouraged to strive for BMI less than 30 to decrease cardiac risk. Advised to aim for at least 150 minutes of exercise per week. Will check HgbA1c due to weight

## 2023-05-27 NOTE — Assessment & Plan Note (Signed)
Influenza vaccine administered Encouraged to take Tylenol as needed for fever or muscle aches.

## 2023-06-21 ENCOUNTER — Other Ambulatory Visit: Payer: Self-pay | Admitting: Nurse Practitioner

## 2023-06-21 DIAGNOSIS — Z3041 Encounter for surveillance of contraceptive pills: Secondary | ICD-10-CM

## 2023-08-13 ENCOUNTER — Other Ambulatory Visit: Payer: Self-pay | Admitting: Nurse Practitioner

## 2023-08-13 DIAGNOSIS — Z3041 Encounter for surveillance of contraceptive pills: Secondary | ICD-10-CM

## 2023-09-11 ENCOUNTER — Other Ambulatory Visit: Payer: Self-pay | Admitting: Nurse Practitioner

## 2023-09-11 DIAGNOSIS — Z3041 Encounter for surveillance of contraceptive pills: Secondary | ICD-10-CM

## 2023-10-30 ENCOUNTER — Encounter: Payer: Self-pay | Admitting: Nurse Practitioner

## 2024-01-05 ENCOUNTER — Encounter: Payer: Self-pay | Admitting: Nurse Practitioner

## 2024-01-05 NOTE — Telephone Encounter (Signed)
 Can you see if I have an earlier appt available or see if Bruna has any openings before the 22nd of this month

## 2024-01-20 ENCOUNTER — Encounter: Payer: Self-pay | Admitting: Family Medicine

## 2024-01-20 ENCOUNTER — Ambulatory Visit (INDEPENDENT_AMBULATORY_CARE_PROVIDER_SITE_OTHER): Admitting: Family Medicine

## 2024-01-20 VITALS — BP 100/60 | HR 84 | Temp 98.3°F | Ht 69.0 in | Wt 203.0 lb

## 2024-01-20 DIAGNOSIS — M940 Chondrocostal junction syndrome [Tietze]: Secondary | ICD-10-CM

## 2024-01-20 DIAGNOSIS — R002 Palpitations: Secondary | ICD-10-CM

## 2024-01-20 DIAGNOSIS — Z23 Encounter for immunization: Secondary | ICD-10-CM

## 2024-01-20 NOTE — Progress Notes (Signed)
 I,Jameka J Llittleton, CMA,acting as a Neurosurgeon for [employer], NP.,have documented all relevant documentation on the behalf of Bruna Creighton, NP,as directed by  Bruna Creighton, NP while in the presence of Bruna Creighton, NP.  Subjective:  Patient ID: Sophia King , female    DOB: February 05, 2001 , 23 y.o.   MRN: [id number]  Chief Complaint  Patient presents with   Referral    Patient presents today for a referral to cardiology. She reports she had a referral before but she wasn't able to make appt and then when she tried to make another appointment her insurance lapsed.     HPI Discussed the use of AI scribe software for clinical note transcription with the patient, who gave verbal consent to proceed.  History of Present Illness     Sophia King is a 23 year old female with costochondritis who presents with worsening chest pain, palpitations, and shortness of breath.  She has been experiencing chest pain, palpitations, and shortness of breath, which have been worsening since she began in 2023. Initially diagnosed with costochondritis, she was prescribed a strong ibuprofen  for chest popping and inflammation. However, the symptoms have progressively worsened, with recent episodes feeling like a heart attack, characterized by severe chest pain radiating down her arm and leg.  The chest pain occurs without any physical exertion or pulling, and the flares of costochondritis are becoming more severe and frequent. During a recent holiday weekend, she experienced significant pain that left her bedridden for three days.  In terms of family history, her maternal grandfather had a heart attack and a stroke, which were attributed to multiple factors including cancer.  No fever or other systemic symptoms.      Past Medical History:  Diagnosis Date   Medical history non-contributory      Family History  Problem Relation Age of Onset   Hypertension Maternal Grandfather    Cancer Maternal Grandmother     Diabetes Maternal Grandmother    Stroke Maternal Grandmother      Current Outpatient Medications:    LO LOESTRIN FE  1 MG-10 MCG / 10 MCG tablet, TAKE 1 TABLET BY MOUTH EVERY DAY, Disp: 84 tablet, Rfl: 3   No Known Allergies   Review of Systems  Constitutional: Negative.   HENT: Negative.    Respiratory: Negative.    Cardiovascular:  Positive for chest pain and palpitations.  Musculoskeletal:  Positive for arthralgias.  Psychiatric/Behavioral: Negative.       Today's Vitals   01/20/24 1555  BP: 100/60  Pulse: 84  Temp: 98.3 F (36.8 C)  TempSrc: Oral  Weight: 203 lb (92.1 kg)  Height: 5' 9 (1.753 m)  PainSc: 0-No pain   Body mass index is 29.98 kg/m.  Wt Readings from Last 3 Encounters:  01/20/24 203 lb (92.1 kg)  05/18/23 207 lb (93.9 kg)  07/17/22 195 lb 9.6 oz (88.7 kg)    The ASCVD Risk score (Arnett DK, et al., 2019) failed to calculate for the following reasons:   The 2019 ASCVD risk score is only valid for ages 36 to 57  Objective:  Physical Exam HENT:     Head: Normocephalic.  Cardiovascular:     Rate and Rhythm: Normal rate and regular rhythm.  Pulmonary:     Effort: Pulmonary effort is normal.     Breath sounds: Normal breath sounds.  Chest:     Chest wall: Tenderness present.  Neurological:     General: No focal deficit present.  Mental Status: She is alert and oriented to person, place, and time.         Assessment And Plan:  Costochondritis Assessment & Plan: Chronic costochondritis with worsening chest pain and palpitations, symptoms mimicking myocardial infarction.        Need for influenza vaccination -     Flu vaccine trivalent PF, 6mos and older(Flulaval,Afluria,Fluarix,Fluzone)  Palpitations Assessment & Plan: - Order Zio patch for 10-14 days to monitor heart activity. - Refer to cardiologist post Zio patch results.    Orders: -     Ambulatory referral to Cardiology -     LONG TERM MONITOR-LIVE TELEMETRY (3-14  DAYS); Future    Assessment & Plan Costochondritis with recurrent chest pain and palpitations under cardiac evaluation Previous ibuprofen  treatment insufficient. Differential includes cardiac causes due to family history. - Order Zio patch for 10-14 days to monitor heart activity. - Refer to cardiologist post Zio patch results.   Return if symptoms worsen or fail to improve.  Patient was given opportunity to ask questions. Patient verbalized understanding of the plan and was able to repeat key elements of the plan. All questions were answered to their satisfaction.    I, Bruna Creighton, NP, have reviewed all documentation for this visit. The documentation on 01/16/23 for the exam, diagnosis, procedures, and orders are all accurate and complete.   IF YOU HAVE BEEN REFERRED TO A SPECIALIST, IT MAY TAKE 1-2 WEEKS TO SCHEDULE/PROCESS THE REFERRAL. IF YOU HAVE NOT HEARD FROM US /SPECIALIST IN TWO WEEKS, PLEASE GIVE US  A CALL AT [phone].

## 2024-01-21 ENCOUNTER — Ambulatory Visit: Admitting: Family Medicine

## 2024-01-25 ENCOUNTER — Ambulatory Visit: Admitting: Nurse Practitioner

## 2024-01-28 NOTE — Assessment & Plan Note (Signed)
-   Order Zio patch for 10-14 days to monitor heart activity. - Refer to cardiologist post Zio patch results.

## 2024-01-28 NOTE — Assessment & Plan Note (Addendum)
 Chronic costochondritis with worsening chest pain and palpitations, symptoms mimicking myocardial infarction.

## 2024-02-23 ENCOUNTER — Other Ambulatory Visit: Payer: Self-pay

## 2024-02-23 ENCOUNTER — Encounter: Payer: Self-pay | Admitting: Nurse Practitioner

## 2024-02-23 ENCOUNTER — Ambulatory Visit: Payer: Self-pay | Attending: Family Medicine

## 2024-02-23 DIAGNOSIS — R002 Palpitations: Secondary | ICD-10-CM

## 2024-02-23 NOTE — Progress Notes (Unsigned)
 EP to read.

## 2024-02-27 ENCOUNTER — Other Ambulatory Visit: Payer: Self-pay

## 2024-02-27 ENCOUNTER — Emergency Department (HOSPITAL_BASED_OUTPATIENT_CLINIC_OR_DEPARTMENT_OTHER)
Admission: EM | Admit: 2024-02-27 | Discharge: 2024-02-27 | Disposition: A | Discharge status: Home / Self Care | Attending: Emergency Medicine | Admitting: Emergency Medicine

## 2024-02-27 ENCOUNTER — Emergency Department (HOSPITAL_BASED_OUTPATIENT_CLINIC_OR_DEPARTMENT_OTHER)

## 2024-02-27 DIAGNOSIS — R002 Palpitations: Secondary | ICD-10-CM | POA: Diagnosis not present

## 2024-02-27 DIAGNOSIS — R0602 Shortness of breath: Secondary | ICD-10-CM | POA: Diagnosis not present

## 2024-02-27 DIAGNOSIS — R0789 Other chest pain: Secondary | ICD-10-CM | POA: Insufficient documentation

## 2024-02-27 LAB — D-DIMER, QUANTITATIVE: D-Dimer, Quant: <0.27 ug{FEU}/mL (ref 0.00–0.50)

## 2024-02-27 LAB — CBC
HCT: 40.2 % (ref 36.0–46.0)
Hemoglobin: 13.1 g/dL (ref 12.0–15.0)
MCH: 28.7 pg (ref 26.0–34.0)
MCHC: 32.6 g/dL (ref 30.0–36.0)
MCV: 88 fL (ref 80.0–100.0)
Platelets: 309 K/uL (ref 150–400)
RBC: 4.57 MIL/uL (ref 3.87–5.11)
RDW: 12 % (ref 11.5–15.5)
WBC: 7.4 K/uL (ref 4.0–10.5)
nRBC: 0 % (ref 0.0–0.2)

## 2024-02-27 LAB — TSH: TSH: 2.79 u[IU]/mL (ref 0.350–4.500)

## 2024-02-27 LAB — COMPREHENSIVE METABOLIC PANEL WITH GFR
ALT: 39 U/L (ref 0–44)
AST: 22 U/L (ref 15–41)
Albumin: 4.5 g/dL (ref 3.5–5.0)
Alkaline Phosphatase: 47 U/L (ref 38–126)
Anion gap: 12 (ref 5–15)
BUN: 11 mg/dL (ref 6–20)
CO2: 24 mmol/L (ref 22–32)
Calcium: 10 mg/dL (ref 8.9–10.3)
Chloride: 104 mmol/L (ref 98–111)
Creatinine, Ser: 0.69 mg/dL (ref 0.44–1.00)
GFR, Estimated: >60 mL/min (ref >60)
Glucose, Bld: 107 mg/dL — ABNORMAL HIGH (ref 70–99)
Potassium: 4.1 mmol/L (ref 3.5–5.1)
Sodium: 140 mmol/L (ref 135–145)
Total Bilirubin: 0.5 mg/dL (ref 0.0–1.2)
Total Protein: 8 g/dL (ref 6.5–8.1)

## 2024-02-27 LAB — TROPONIN T, HIGH SENSITIVITY: Troponin T High Sensitivity: <15 ng/L (ref 0–19)

## 2024-02-27 LAB — PREGNANCY, URINE: Preg Test, Ur: NEGATIVE

## 2024-02-27 NOTE — ED Triage Notes (Signed)
 Pt POV with c/o of chest tightness and SOB since labor day, been getting worse since then. Hx of symptoms previously, following up with primary and cardiology with costochondritis.

## 2024-02-27 NOTE — Discharge Instructions (Signed)
 It was our pleasure to provide your ER care today - we hope that you feel better.  If GI/reflux symptoms, try taking pepcid and maalox as need for symptom relief.   For recent chest pain, follow up closely with cardiologist in the next 1-2 weeks - we made referral, and they should be contacting you with an appointment in the next few days.   Return to ER right away if worse, new symptoms, fevers, recurrent/persistent chest pain, increased trouble breathing, persistent fast heart beating, fainting, or other concern.

## 2024-02-27 NOTE — ED Provider Notes (Signed)
 [place] EMERGENCY DEPARTMENT AT [facility] Provider Note   CSN: [id number] Arrival date & time: 02/27/24  9251     Patient presents with: Chest Pain and Shortness of Breath   Sophia King is a 23 y.o. female.   Pt c/o palpitations in past months, intermittent, and at times feels tight in chest with mild doe. Symptoms at rest, episodic, recurrent, without consistent or specific exacerbating or alleviating factors. Has seen pcp w same - has referral to cardiology and plans for zio monitor. No hx syncope. No prior diagnosis heart issues or dysrhythmia. No family hx premature heart disease. Denies any constant or pleuritic pain. No nv or diaphoresis. No exertional chest pain or discomfort. No heartburn. No chest wall strain or injury. No leg pain or swelling. No recent surgery or immobility. No hx dvt or pe. No back, neck or flank pain.  No heat intolerance, sweats or weight change.  No change in meds.   The history is provided by the patient and medical records.  Chest Pain Associated symptoms: shortness of breath   Associated symptoms: no abdominal pain, no back pain, no cough, no fever, no headache, no nausea, no palpitations and no vomiting   Shortness of Breath Associated symptoms: chest pain   Associated symptoms: no abdominal pain, no cough, no fever, no headaches, no neck pain, no rash, no sore throat and no vomiting        Prior to Admission medications   Medication Sig Start Date End Date Taking? Authorizing Provider  LO LOESTRIN FE  1 MG-10 MCG / 10 MCG tablet TAKE 1 TABLET BY MOUTH EVERY DAY 06/22/23   Moore, Janece, FNP    Allergies: Patient has no known allergies.    Review of Systems  Constitutional:  Negative for chills and fever.  HENT:  Negative for sore throat.   Respiratory:  Positive for shortness of breath. Negative for cough.   Cardiovascular:  Positive for chest pain. Negative for palpitations and leg swelling.  Gastrointestinal:  Negative for  abdominal pain, nausea and vomiting.  Genitourinary:  Negative for flank pain.  Musculoskeletal:  Negative for back pain and neck pain.  Skin:  Negative for rash.  Neurological:  Negative for headaches.    Updated Vital Signs BP 114/83 (BP Location: Right Arm)   Pulse (!) 105   Temp 99.5 F (37.5 C) (Oral)   Resp 19   Ht 1.753 m (5' 9)   Wt 91.6 kg   LMP 02/12/2024   SpO2 100%   BMI 29.83 kg/m   Physical Exam Vitals and nursing note reviewed.  Constitutional:      Appearance: Normal appearance. She is well-developed.  HENT:     Head: Atraumatic.     Nose: Nose normal.     Mouth/Throat:     Mouth: Mucous membranes are moist.  Eyes:     General: No scleral icterus.    Conjunctiva/sclera: Conjunctivae normal.     Pupils: Pupils are equal, round, and reactive to light.  Neck:     Trachea: No tracheal deviation.     Comments: Trachea midline, thyroid  not grossly enlarged or tender. No neck stiffness or rigidity.  Cardiovascular:     Rate and Rhythm: Regular rhythm. Tachycardia present.     Pulses: Normal pulses.     Heart sounds: Normal heart sounds. No murmur heard.    No friction rub. No gallop.  Pulmonary:     Effort: Pulmonary effort is normal. No respiratory distress.  Breath sounds: Normal breath sounds.  Abdominal:     General: Bowel sounds are normal. There is no distension.     Palpations: Abdomen is soft. There is no mass.     Tenderness: There is no abdominal tenderness. There is no guarding.  Genitourinary:    Comments: No cva tenderness.  Musculoskeletal:        General: No swelling or tenderness.     Cervical back: Normal range of motion and neck supple. No rigidity. No muscular tenderness.     Right lower leg: No edema.     Left lower leg: No edema.  Lymphadenopathy:     Cervical: No cervical adenopathy.  Skin:    General: Skin is warm and dry.     Findings: No rash.  Neurological:     Mental Status: She is alert.     Comments: Alert, speech  normal. Motor/sens grossly intact bil. Steady gait.   Psychiatric:        Mood and Affect: Mood normal.     (all labs ordered are listed, but only abnormal results are displayed) Results for orders placed or performed during the hospital encounter of 02/27/24  Comprehensive metabolic panel with GFR   Collection Time: 02/27/24  8:00 AM  Result Value Ref Range   Sodium 140 135 - 145 mmol/L   Potassium 4.1 3.5 - 5.1 mmol/L   Chloride 104 98 - 111 mmol/L   CO2 24 22 - 32 mmol/L   Glucose, Bld 107 (H) 70 - 99 mg/dL   BUN 11 6 - 20 mg/dL   Creatinine, Ser 9.30 0.44 - 1.00 mg/dL   Calcium  10.0 8.9 - 10.3 mg/dL   Total Protein 8.0 6.5 - 8.1 g/dL   Albumin 4.5 3.5 - 5.0 g/dL   AST 22 15 - 41 U/L   ALT 39 0 - 44 U/L   Alkaline Phosphatase 47 38 - 126 U/L   Total Bilirubin 0.5 0.0 - 1.2 mg/dL   GFR, Estimated >39 >39 mL/min   Anion gap 12 5 - 15  CBC   Collection Time: 02/27/24  8:00 AM  Result Value Ref Range   WBC 7.4 4.0 - 10.5 K/uL   RBC 4.57 3.87 - 5.11 MIL/uL   Hemoglobin 13.1 12.0 - 15.0 g/dL   HCT 59.7 63.9 - 53.9 %   MCV 88.0 80.0 - 100.0 fL   MCH 28.7 26.0 - 34.0 pg   MCHC 32.6 30.0 - 36.0 g/dL   RDW 87.9 88.4 - 84.4 %   Platelets 309 150 - 400 K/uL   nRBC 0.0 0.0 - 0.2 %  TSH   Collection Time: 02/27/24  8:00 AM  Result Value Ref Range   TSH 2.790 0.350 - 4.500 uIU/mL  D-dimer, quantitative   Collection Time: 02/27/24  8:00 AM  Result Value Ref Range   D-Dimer, Quant <0.27 0.00 - 0.50 ug/mL-FEU  Pregnancy, urine   Collection Time: 02/27/24  8:00 AM  Result Value Ref Range   Preg Test, Ur NEGATIVE NEGATIVE  Troponin T, High Sensitivity   Collection Time: 02/27/24  8:00 AM  Result Value Ref Range   Troponin T High Sensitivity <15 0 - 19 ng/L    ED ECG REPORT   Date: 02/27/2024  Rate: 108  Rhythm: sinus tachycardia  QRS Axis: normal  Intervals: normal  ST/T Wave abnormalities: normal  Conduction Disutrbances:none  Narrative Interpretation:   Old EKG  Reviewed: unchanged  I have personally reviewed the EKG tracing  Radiology: [facility] Chest Port 1 View Result Date: 02/27/2024 EXAM: 1 VIEW(S) XRAY OF THE CHEST 02/27/2024 08:09:28 AM COMPARISON: 12/03/2022 CLINICAL HISTORY: pain FINDINGS: LUNGS AND PLEURA: No focal pulmonary opacity. No pulmonary edema. No pleural effusion. No pneumothorax. HEART AND MEDIASTINUM: No acute abnormality of the cardiac and mediastinal silhouettes. BONES AND SOFT TISSUES: No acute osseous abnormality. IMPRESSION: 1. No acute cardiopulmonary process. Electronically signed by: Waddell Calk MD 02/27/2024 08:28 AM EDT RP Workstation: [id number]     Procedures   Medications Ordered in the ED - No data to display                                  Medical Decision Making Problems Addressed: Atypical chest pain: acute illness or injury with systemic symptoms that poses a threat to life or bodily functions Palpitations: acute illness or injury with systemic symptoms that poses a threat to life or bodily functions  Amount and/or Complexity of Data Reviewed External Data Reviewed: notes. Labs: ordered. Decision-making details documented in ED Course. Radiology: ordered and independent interpretation performed. Decision-making details documented in ED Course. ECG/medicine tests: ordered and independent interpretation performed. Decision-making details documented in ED Course.  Risk Decision regarding hospitalization.   Iv ns. Continuous pulse ox and cardiac monitoring. Labs ordered/sent. Imaging ordered.   Differential diagnosis includes acs, msk cp, gi cp, etc. Dispo decision including potential need for admission considered - will get labs and imaging and reassess.   Reviewed nursing notes and prior charts for additional history. External reports reviewed.   Cardiac monitor: sinus rhythm, rate 110.   Labs reviewed/interpreted by me - wbc and hgb normal. Ddimer normal. Preg neg. Trop normal - after prolonged  symptoms, felt not c/w acs. TSH normal.   Xrays reviewed/interpreted by me - no pna.   Remains in nsr, no dysrhythmia noted.  Currently hr 88, rr 14, pulse ox 100% room air.   Pt currently appears stable for ED d/c.   Rec close pcp/cardiology f/u.  Return precautions provided.       Final diagnoses:  Palpitations  Atypical chest pain    ED Discharge Orders     None          Bernard Drivers, MD 02/27/24 [phone]

## 2024-04-15 DIAGNOSIS — R002 Palpitations: Secondary | ICD-10-CM

## 2024-04-19 ENCOUNTER — Ambulatory Visit (INDEPENDENT_AMBULATORY_CARE_PROVIDER_SITE_OTHER): Admitting: Cardiology

## 2024-04-19 ENCOUNTER — Encounter (HOSPITAL_BASED_OUTPATIENT_CLINIC_OR_DEPARTMENT_OTHER): Payer: Self-pay | Admitting: Cardiology

## 2024-04-19 VITALS — BP 112/54 | HR 87 | Ht 69.0 in | Wt 207.8 lb

## 2024-04-19 DIAGNOSIS — M940 Chondrocostal junction syndrome [Tietze]: Secondary | ICD-10-CM

## 2024-04-19 DIAGNOSIS — Z712 Person consulting for explanation of examination or test findings: Secondary | ICD-10-CM

## 2024-04-19 DIAGNOSIS — R002 Palpitations: Secondary | ICD-10-CM

## 2024-04-19 DIAGNOSIS — Z7189 Other specified counseling: Secondary | ICD-10-CM

## 2024-04-19 NOTE — Patient Instructions (Signed)

## 2024-04-19 NOTE — Progress Notes (Signed)
 Cardiology Office Note:  .   Date:  04/19/2024  ID:  Sophia King, DOB 04/15/2001, MRN [id number] PCP: Georgina Speaks, FNP  [facility] Providers Cardiologist:  Shelda Bruckner, MD {  History of Present Illness: Sophia King is a 23 y.o. female with PMH costochondritis. She is seen as a new patient consultation at the request of Dr. Bernard for chest pain, palpitations.  Referrals from both 01/20/24 and 02/27/24. Reviewed available visit notes.   Today: Was seeing Dr. Claudene in 2022 at [facility], was diagnosed with costochondritis, prescribed ibuprofen  but never needed. Got local anesthesia last year, had heart racing after and felt like she might pass out. Labor day this year, had sudden onset leg/arm pain, was concerned she might be having a heart attack, came to ER. Notes that her heart will start racing randomly. Was seen in the ER 02/27/24 for chest pain, shortness of breath, palpitations.   Reviewed her Zio together. She did have symptoms while wearing the monitor. Reviewed her report together; 41 events, all sinus rhythm/sinus tach. We discussed regular rhythm vs. Arrthymia at length.   She is in school, son has autism, has a lot of stress in her life. We discussed how stress can amplify nervous system responses. Has found some triggers, like heat, illness, etc. Chest pain is 1-2 times/week, noticeable but not unbearable. Helped by ibuprofen , popping her chest.   Able to exercise, be active, no limitations.   FH: maternal grandfather had heart attack and stroke after chemo for prostate cancer. No other known heart disease.  ROS: Denies PND, orthopnea, LE edema or unexpected weight gain. No syncope. ROS otherwise negative except as noted.   Studies Reviewed: Sophia    EKG:       Physical Exam:   VS:  BP (!) 112/54   Pulse 87   Ht 5' 9 (1.753 m)   Wt 207 lb 12.8 oz (94.3 kg)   SpO2 98%   BMI 30.69 kg/m    Wt Readings from Last 3 Encounters:  04/19/24  207 lb 12.8 oz (94.3 kg)  02/27/24 202 lb (91.6 kg)  01/20/24 203 lb (92.1 kg)    GEN: Well nourished, well developed in no acute distress HEENT: Normal, moist mucous membranes NECK: No JVD CARDIAC: regular rhythm, normal S1 and S2, no rubs or gallops. No murmur. VASCULAR: Radial and DP pulses 2+ bilaterally. No carotid bruits RESPIRATORY:  Clear to auscultation without rales, wheezing or rhonchi  ABDOMEN: Soft, non-tender, non-distended MUSCULOSKELETAL:  Ambulates independently SKIN: Warm and dry, no edema NEUROLOGIC:  Alert and oriented x 3. No focal neuro deficits noted. PSYCHIATRIC:  Normal affect    ASSESSMENT AND PLAN: .    Palpitations Chest pain -reviewed her monitor results together today. Very reassuring. Reviewed other etiologies for symptoms that are non-cardiac -history of costochondritis, intermittent. Reviewed how to manage these symptoms  CV risk counseling and prevention -recommend heart healthy/Mediterranean diet, with whole grains, fruits, vegetable, fish, lean meats, nuts, and olive oil. Limit salt. -recommend moderate walking, 3-5 times/week for 30-50 minutes each session. Aim for at least 150 minutes/week. Goal should be pace of 3 miles/hours, or walking 1.5 miles in 30 minutes -recommend avoidance of tobacco products. Avoid excess alcohol. -ASCVD risk score: The ASCVD Risk score (Arnett DK, et al., 2019) failed to calculate for the following reasons:   The 2019 ASCVD risk score is only valid for ages 11 to 98   * - Cholesterol units were  assumed    Dispo: I would be happy to see her back as needed  Signed, Shelda Bruckner, MD   Shelda Bruckner, MD, PhD, [facility] [place]  [facility]  [place]  Heart & Vascular at [facility] at [facility] [address] [place], [place] [address] [phone]

## 2024-05-17 NOTE — Progress Notes (Signed)
 LILLETTE Kristeen JINNY Gladis, CMA,acting as a neurosurgeon for Gaines Ada, FNP.,have documented all relevant documentation on the behalf of Gaines Ada, FNP,as directed by  Gaines Ada, FNP while in the presence of Gaines Ada, FNP.  Subjective:    Patient ID: Sophia King , female    DOB: [birth year] , 24 y.o.   MRN: [id number]  Chief Complaint  Patient presents with   Annual Exam    Patient presents today for HM, Patient reports compliance with medication. Patient denies any chest pain, SOB, or headaches. Patient has no concerns today.     She has been to the Cardiologist recently due to increased heart rate    Discussed the use of AI scribe software for clinical note transcription with the patient, who gave verbal consent to proceed.  History of Present Illness Sophia King is a 24 year old female who presents for an annual physical exam.  She has a history of palpitations, which were evaluated by a cardiologist in October. She wore a heart monitor, and the results were normal. She attributes these episodes to anxiety. She denies regular caffeine intake and primarily drinks water.  She experiences easy bruising, particularly after minor trauma, with prolonged healing time. A recent bruise on her calf has been healing for two to three weeks. She denies taking aspirin or BC powders but has started taking vitamin D. She prefers to avoid medication unless necessary.  She works from home and has a five-year-old son. She exercises two to three times a week and is trying to improve her diet to lower her cholesterol by reducing snacking. She is currently on birth control, and her last menstrual cycle began on the second of the month.  No swelling in her feet or ankles, constipation, diarrhea, trouble swallowing, or blood in her stool.  Past Medical History:  Diagnosis Date   Medical history non-contributory      Family History  Problem Relation Age of Onset   Arrhythmia Mother    Hypertension  Maternal Grandfather    Heart attack Maternal Grandfather    Cancer Maternal Grandmother    Diabetes Maternal Grandmother    Stroke Maternal Grandmother     Current Medications[1]   Allergies[2]    The patient states she uses OCP (estrogen/progesterone) for birth control. Patient's last menstrual period was 05/06/2024.. Negative for Dysmenorrhea and Negative for Menorrhagia. Negative for: breast discharge, breast lump(s), breast pain and breast self exam. Associated symptoms include abnormal vaginal bleeding. Pertinent negatives include abnormal bleeding (hematology), anxiety, decreased libido, depression, difficulty falling sleep, dyspareunia, history of infertility, nocturia, sexual dysfunction, sleep disturbances, urinary incontinence, urinary urgency, vaginal discharge and vaginal itching. Diet regular; admits to snacking a lot but back on cholesterol. The patient states her exercise level is minimal to moderate - 2-3 times a week.   The patient's tobacco use is: Tobacco Use History[3]. She has been exposed to passive smoke. The patient's alcohol use is:  Social History   Substance and Sexual Activity  Alcohol Use Yes   Comment: socially    Review of Systems  Constitutional: Negative.   HENT: Negative.    Eyes: Negative.   Respiratory: Negative.    Cardiovascular: Negative.   Gastrointestinal: Negative.   Endocrine: Negative.   Genitourinary: Negative.   Musculoskeletal: Negative.   Skin: Negative.   Allergic/Immunologic: Negative.   Neurological: Negative.   Hematological: Negative.   Psychiatric/Behavioral: Negative.       Today's Vitals   05/18/24 1027  BP: 100/70  Pulse: 90  Temp: 98.5 F (36.9 C)  TempSrc: Oral  Weight: 209 lb (94.8 kg)  Height: 5' 9 (1.753 m)  PainSc: 0-No pain   Body mass index is 30.86 kg/m.  Wt Readings from Last 3 Encounters:  05/18/24 209 lb (94.8 kg)  04/19/24 207 lb 12.8 oz (94.3 kg)  02/27/24 202 lb (91.6 kg)     Objective:   Physical Exam Vitals and nursing note reviewed.  Constitutional:      General: She is not in acute distress.    Appearance: Normal appearance. She is well-developed. She is obese.  HENT:     Head: Normocephalic and atraumatic.     Right Ear: Hearing, tympanic membrane, ear canal and external ear normal. There is no impacted cerumen.     Left Ear: Hearing, tympanic membrane, ear canal and external ear normal. There is no impacted cerumen.     Nose: Nose normal.     Mouth/Throat:     Mouth: Mucous membranes are moist.  Eyes:     General: Lids are normal.     Extraocular Movements: Extraocular movements intact.     Conjunctiva/sclera: Conjunctivae normal.     Pupils: Pupils are equal, round, and reactive to light.     Funduscopic exam:    Right eye: No papilledema.        Left eye: No papilledema.  Neck:     Thyroid : No thyroid  mass.     Vascular: No carotid bruit.  Cardiovascular:     Rate and Rhythm: Normal rate and regular rhythm.     Pulses: Normal pulses.     Heart sounds: Normal heart sounds. No murmur heard. Pulmonary:     Effort: Pulmonary effort is normal. No respiratory distress.     Breath sounds: Normal breath sounds. No wheezing.  Chest:     Chest wall: No mass.  Breasts:    Tanner Score is 5.     Right: Normal. No mass or tenderness.     Left: Normal. No mass or tenderness.  Abdominal:     General: Abdomen is flat. Bowel sounds are normal. There is no distension.     Palpations: Abdomen is soft.     Tenderness: There is no abdominal tenderness.  Genitourinary:    Comments: Deferred  Musculoskeletal:        General: No swelling. Normal range of motion.     Cervical back: Full passive range of motion without pain, normal range of motion and neck supple.     Right lower leg: No edema.     Left lower leg: No edema.  Lymphadenopathy:     Upper Body:     Right upper body: No supraclavicular, axillary or pectoral adenopathy.     Left upper body: No  supraclavicular, axillary or pectoral adenopathy.  Skin:    General: Skin is warm and dry.     Capillary Refill: Capillary refill takes less than 2 seconds.     Findings: Bruising (bilateral legs) present.  Neurological:     General: No focal deficit present.     Mental Status: She is alert and oriented to person, place, and time.     Cranial Nerves: No cranial nerve deficit.     Sensory: No sensory deficit.     Motor: No weakness.  Psychiatric:        Mood and Affect: Mood normal.        Behavior: Behavior normal.        Thought Content:  Thought content normal.        Judgment: Judgment normal.      Assessment And Plan:     Encounter for annual health examination Assessment & Plan: Behavior modifications discussed and diet history reviewed.   Pt will continue to exercise regularly and modify diet with low GI, plant based foods and decrease intake of processed foods.  Recommend intake of daily multivitamin, Vitamin D, and calcium .  Recommend monthly self breast exams for preventive screenings, as well as recommend immunizations that include influenza Discussed need for gynecological referral. - Sent referral to Dr. Rutherford for gynecological evaluation.    Mixed hyperlipidemia Assessment & Plan: Ongoing management with dietary modifications. - Continue dietary modifications to reduce cholesterol intake.  Orders: -     CMP14+EGFR -     Lipid panel -     Lipoprotein A (LPA)  Screening for STDs (sexually transmitted diseases) -     NuSwab Vaginitis Plus (VG+) -     RPR W/RFLX TO RPR TITER, TREPONEMAL AB, SCREEN AND DIAGNOSIS -     HIV Antibody (routine testing w rflx)  Class 1 obesity due to excess calories with body mass index (BMI) of 30.0 to 30.9 in adult, unspecified whether serious comorbidity present Assessment & Plan: She is encouraged to strive for BMI less than 30 to decrease cardiac risk. Advised to aim for at least 150 minutes of exercise per  week.   Orders: -     Hemoglobin A1c  Other long term (current) drug therapy -     CBC with Differential/Platelet  Encounter for gynecological examination -     Ambulatory referral to Obstetrics / Gynecology  Easy bruising Assessment & Plan: Reports easy bruising with prolonged healing. No medications increasing bleeding risk. Possible thin capillaries. - Ordered CBC to evaluate platelet count and clotting factors.     Return for 1 year physical, 6 month chol check. Patient was given opportunity to ask questions. Patient verbalized understanding of the plan and was able to repeat key elements of the plan. All questions were answered to their satisfaction.   Gaines Ada, FNP  I, Gaines Ada, FNP, have reviewed all documentation for this visit. The documentation on 05/18/24 for the exam, diagnosis, procedures, and orders are all accurate and complete.      [1]  Current Outpatient Medications:    LO LOESTRIN FE  1 MG-10 MCG / 10 MCG tablet, TAKE 1 TABLET BY MOUTH EVERY DAY, Disp: 28 tablet, Rfl: 11 [2] No Known Allergies [3]  Social History Tobacco Use  Smoking Status Never  Smokeless Tobacco Never

## 2024-05-18 ENCOUNTER — Ambulatory Visit (INDEPENDENT_AMBULATORY_CARE_PROVIDER_SITE_OTHER): Payer: Managed Care, Other (non HMO) | Admitting: Nurse Practitioner

## 2024-05-18 ENCOUNTER — Encounter: Payer: Self-pay | Admitting: Nurse Practitioner

## 2024-05-18 VITALS — BP 100/70 | HR 90 | Temp 98.5°F | Ht 69.0 in | Wt 209.0 lb

## 2024-05-18 DIAGNOSIS — R233 Spontaneous ecchymoses: Secondary | ICD-10-CM | POA: Diagnosis not present

## 2024-05-18 DIAGNOSIS — E6609 Other obesity due to excess calories: Secondary | ICD-10-CM

## 2024-05-18 DIAGNOSIS — E66811 Obesity, class 1: Secondary | ICD-10-CM

## 2024-05-18 DIAGNOSIS — Z6830 Body mass index (BMI) 30.0-30.9, adult: Secondary | ICD-10-CM

## 2024-05-18 DIAGNOSIS — Z0000 Encounter for general adult medical examination without abnormal findings: Secondary | ICD-10-CM

## 2024-05-18 DIAGNOSIS — Z79899 Other long term (current) drug therapy: Secondary | ICD-10-CM

## 2024-05-18 DIAGNOSIS — Z113 Encounter for screening for infections with a predominantly sexual mode of transmission: Secondary | ICD-10-CM

## 2024-05-18 DIAGNOSIS — E782 Mixed hyperlipidemia: Secondary | ICD-10-CM

## 2024-05-18 DIAGNOSIS — Z01419 Encounter for gynecological examination (general) (routine) without abnormal findings: Secondary | ICD-10-CM

## 2024-05-19 ENCOUNTER — Ambulatory Visit: Payer: Self-pay | Admitting: Nurse Practitioner

## 2024-05-19 DIAGNOSIS — E7841 Elevated Lipoprotein(a): Secondary | ICD-10-CM

## 2024-05-19 LAB — CBC WITH DIFFERENTIAL/PLATELET
Basophils Absolute: 0 x10E3/uL (ref 0.0–0.2)
Basos: 0 %
EOS (ABSOLUTE): 0.3 x10E3/uL (ref 0.0–0.4)
Eos: 4 %
Hematocrit: 42.3 % (ref 34.0–46.6)
Hemoglobin: 13.7 g/dL (ref 11.1–15.9)
Immature Grans (Abs): 0 x10E3/uL (ref 0.0–0.1)
Immature Granulocytes: 0 %
Lymphocytes Absolute: 2.3 x10E3/uL (ref 0.7–3.1)
Lymphs: 29 %
MCH: 29 pg (ref 26.6–33.0)
MCHC: 32.4 g/dL (ref 31.5–35.7)
MCV: 89 fL (ref 79–97)
Monocytes Absolute: 0.6 x10E3/uL (ref 0.1–0.9)
Monocytes: 8 %
Neutrophils Absolute: 4.6 x10E3/uL (ref 1.4–7.0)
Neutrophils: 59 %
Platelets: 334 x10E3/uL (ref 150–450)
RBC: 4.73 x10E6/uL (ref 3.77–5.28)
RDW: 11.8 % (ref 11.7–15.4)
WBC: 7.8 x10E3/uL (ref 3.4–10.8)

## 2024-05-19 LAB — CMP14+EGFR
ALT: 18 IU/L (ref 0–32)
AST: 15 IU/L (ref 0–40)
Albumin: 4.8 g/dL (ref 4.0–5.0)
Alkaline Phosphatase: 45 IU/L (ref 41–116)
BUN/Creatinine Ratio: 11 (ref 9–23)
BUN: 8 mg/dL (ref 6–20)
Bilirubin Total: 0.5 mg/dL (ref 0.0–1.2)
CO2: 22 mmol/L (ref 20–29)
Calcium: 9.8 mg/dL (ref 8.7–10.2)
Chloride: 103 mmol/L (ref 96–106)
Creatinine, Ser: 0.71 mg/dL (ref 0.57–1.00)
Globulin, Total: 2.9 g/dL (ref 1.5–4.5)
Glucose: 90 mg/dL (ref 70–99)
Potassium: 4.4 mmol/L (ref 3.5–5.2)
Sodium: 141 mmol/L (ref 134–144)
Total Protein: 7.7 g/dL (ref 6.0–8.5)
eGFR: 121 mL/min/1.73

## 2024-05-19 LAB — LIPID PANEL
Chol/HDL Ratio: 3.4 ratio (ref 0.0–4.4)
Cholesterol, Total: 189 mg/dL (ref 100–199)
HDL: 56 mg/dL
LDL Chol Calc (NIH): 124 mg/dL — ABNORMAL HIGH (ref 0–99)
Triglycerides: 47 mg/dL (ref 0–149)
VLDL Cholesterol Cal: 9 mg/dL (ref 5–40)

## 2024-05-19 LAB — LIPOPROTEIN A (LPA): Lipoprotein (a): 237.1 nmol/L — ABNORMAL HIGH

## 2024-05-19 LAB — HEMOGLOBIN A1C
Est. average glucose Bld gHb Est-mCnc: 103 mg/dL
Hgb A1c MFr Bld: 5.2 % (ref 4.8–5.6)

## 2024-05-19 LAB — SYPHILIS: RPR W/REFLEX TO RPR TITER AND TREPONEMAL ANTIBODIES, TRADITIONAL SCREENING AND DIAGNOSIS ALGORITHM: RPR Ser Ql: NONREACTIVE

## 2024-05-19 LAB — HIV ANTIBODY (ROUTINE TESTING W REFLEX): HIV Screen 4th Generation wRfx: NONREACTIVE

## 2024-05-20 LAB — NUSWAB VAGINITIS PLUS (VG+)
Candida albicans, NAA: NEGATIVE
Candida glabrata, NAA: NEGATIVE
Chlamydia trachomatis, NAA: NEGATIVE
Megasphaera 1: HIGH {score} — AB
Neisseria gonorrhoeae, NAA: NEGATIVE
Trich vag by NAA: NEGATIVE

## 2024-05-25 ENCOUNTER — Other Ambulatory Visit: Payer: Self-pay | Admitting: Nurse Practitioner

## 2024-05-25 DIAGNOSIS — Z3041 Encounter for surveillance of contraceptive pills: Secondary | ICD-10-CM

## 2024-05-29 NOTE — Assessment & Plan Note (Signed)
 She is encouraged to strive for BMI less than 30 to decrease cardiac risk. Advised to aim for at least 150 minutes of exercise per week.

## 2024-05-29 NOTE — Assessment & Plan Note (Signed)
 Behavior modifications discussed and diet history reviewed.   Pt will continue to exercise regularly and modify diet with low GI, plant based foods and decrease intake of processed foods.  Recommend intake of daily multivitamin, Vitamin D, and calcium .  Recommend monthly self breast exams for preventive screenings, as well as recommend immunizations that include influenza Discussed need for gynecological referral. - Sent referral to Dr. Rutherford for gynecological evaluation.

## 2024-05-29 NOTE — Assessment & Plan Note (Signed)
 Reports easy bruising with prolonged healing. No medications increasing bleeding risk. Possible thin capillaries. - Ordered CBC to evaluate platelet count and clotting factors.

## 2024-05-29 NOTE — Assessment & Plan Note (Signed)
 Ongoing management with dietary modifications. - Continue dietary modifications to reduce cholesterol intake.

## 2024-05-31 ENCOUNTER — Other Ambulatory Visit: Payer: Self-pay | Admitting: Nurse Practitioner

## 2024-05-31 DIAGNOSIS — B9689 Other specified bacterial agents as the cause of diseases classified elsewhere: Secondary | ICD-10-CM

## 2024-05-31 MED ORDER — METRONIDAZOLE 500 MG PO TABS: 500.0000 mg | ORAL_TABLET | Freq: Three times a day (TID) | ORAL | 0 refills | Status: AC

## 2024-11-15 ENCOUNTER — Ambulatory Visit: Payer: Self-pay | Admitting: Nurse Practitioner

## 2025-05-23 ENCOUNTER — Encounter: Payer: Self-pay | Admitting: Nurse Practitioner
